# Patient Record
Sex: Female | Born: 1990 | Race: White | Hispanic: No | Marital: Married | Smoking: Current every day smoker
Health system: Southern US, Community
[De-identification: ages and names within clinical notes are randomized; demographics above are authoritative.]

## PROBLEM LIST (undated history)

## (undated) DIAGNOSIS — K219 Gastro-esophageal reflux disease without esophagitis: Secondary | ICD-10-CM

## (undated) DIAGNOSIS — W540XXA Bitten by dog, initial encounter: Secondary | ICD-10-CM

## (undated) DIAGNOSIS — S81852A Open bite, left lower leg, initial encounter: Secondary | ICD-10-CM

## (undated) DIAGNOSIS — D649 Anemia, unspecified: Secondary | ICD-10-CM

## (undated) HISTORY — PX: TONSILLECTOMY AND ADENOIDECTOMY: SUR1326

---

## 2014-12-28 ENCOUNTER — Other Ambulatory Visit (HOSPITAL_COMMUNITY): Payer: Self-pay | Admitting: Obstetrics and Gynecology

## 2014-12-28 ENCOUNTER — Encounter (HOSPITAL_COMMUNITY): Payer: Self-pay | Admitting: Obstetrics and Gynecology

## 2014-12-28 DIAGNOSIS — O30002 Twin pregnancy, unspecified number of placenta and unspecified number of amniotic sacs, second trimester: Secondary | ICD-10-CM

## 2014-12-28 DIAGNOSIS — O283 Abnormal ultrasonic finding on antenatal screening of mother: Secondary | ICD-10-CM

## 2014-12-28 DIAGNOSIS — Z3A26 26 weeks gestation of pregnancy: Secondary | ICD-10-CM

## 2015-01-15 ENCOUNTER — Encounter (HOSPITAL_COMMUNITY): Payer: Self-pay

## 2015-01-15 ENCOUNTER — Encounter (HOSPITAL_COMMUNITY): Payer: Self-pay | Admitting: Obstetrics and Gynecology

## 2015-01-15 ENCOUNTER — Ambulatory Visit (HOSPITAL_COMMUNITY)
Admission: RE | Admit: 2015-01-15 | Discharge: 2015-01-15 | Disposition: A | Discharge status: Home / Self Care | Payer: Medicaid Other | Source: Ambulatory Visit | Attending: Obstetrics and Gynecology | Admitting: Obstetrics and Gynecology

## 2015-01-15 ENCOUNTER — Other Ambulatory Visit (HOSPITAL_COMMUNITY): Payer: Self-pay | Admitting: Obstetrics and Gynecology

## 2015-01-15 DIAGNOSIS — O30002 Twin pregnancy, unspecified number of placenta and unspecified number of amniotic sacs, second trimester: Secondary | ICD-10-CM

## 2015-01-15 DIAGNOSIS — O98519 Other viral diseases complicating pregnancy, unspecified trimester: Secondary | ICD-10-CM | POA: Diagnosis not present

## 2015-01-15 DIAGNOSIS — B009 Herpesviral infection, unspecified: Secondary | ICD-10-CM | POA: Diagnosis not present

## 2015-01-15 DIAGNOSIS — O358XX0 Maternal care for other (suspected) fetal abnormality and damage, not applicable or unspecified: Secondary | ICD-10-CM | POA: Diagnosis not present

## 2015-01-15 DIAGNOSIS — O30042 Twin pregnancy, dichorionic/diamniotic, second trimester: Secondary | ICD-10-CM

## 2015-01-15 DIAGNOSIS — Z3A26 26 weeks gestation of pregnancy: Secondary | ICD-10-CM

## 2015-01-15 DIAGNOSIS — O283 Abnormal ultrasonic finding on antenatal screening of mother: Secondary | ICD-10-CM

## 2015-01-15 DIAGNOSIS — O359XX2 Maternal care for (suspected) fetal abnormality and damage, unspecified, fetus 2: Secondary | ICD-10-CM

## 2015-01-18 ENCOUNTER — Ambulatory Visit (HOSPITAL_COMMUNITY)
Admission: RE | Admit: 2015-01-18 | Discharge: 2015-01-18 | Disposition: A | Discharge status: Home / Self Care | Payer: Medicaid Other | Source: Ambulatory Visit | Attending: Obstetrics and Gynecology | Admitting: Obstetrics and Gynecology

## 2015-01-18 ENCOUNTER — Encounter (HOSPITAL_COMMUNITY): Payer: Self-pay

## 2015-01-18 DIAGNOSIS — O30042 Twin pregnancy, dichorionic/diamniotic, second trimester: Secondary | ICD-10-CM | POA: Insufficient documentation

## 2015-01-18 DIAGNOSIS — O283 Abnormal ultrasonic finding on antenatal screening of mother: Secondary | ICD-10-CM | POA: Diagnosis not present

## 2015-01-18 DIAGNOSIS — Z315 Encounter for genetic counseling: Secondary | ICD-10-CM | POA: Insufficient documentation

## 2015-01-18 DIAGNOSIS — Z3A26 26 weeks gestation of pregnancy: Secondary | ICD-10-CM | POA: Insufficient documentation

## 2015-01-18 DIAGNOSIS — O30049 Twin pregnancy, dichorionic/diamniotic, unspecified trimester: Secondary | ICD-10-CM

## 2015-01-19 DIAGNOSIS — O283 Abnormal ultrasonic finding on antenatal screening of mother: Secondary | ICD-10-CM | POA: Insufficient documentation

## 2015-01-19 DIAGNOSIS — O30049 Twin pregnancy, dichorionic/diamniotic, unspecified trimester: Secondary | ICD-10-CM | POA: Insufficient documentation

## 2015-01-19 DIAGNOSIS — O30009 Twin pregnancy, unspecified number of placenta and unspecified number of amniotic sacs, unspecified trimester: Secondary | ICD-10-CM | POA: Insufficient documentation

## 2015-01-19 NOTE — Progress Notes (Signed)
Genetic Counseling  High-Risk Gestation Note  Appointment Date:  01/18/2015 Referred By: Damaris Schooner, DO Date of Birth:  02-08-1991 Partner:  Lang Snow   Pregnancy History: [id number] Estimated Date of Delivery: 04/21/15 Estimated Gestational Age: 59w5dAttending: PBenjaman Lobe MD   I met with Mrs. TFaith Roguefor genetic counseling because of abnormal ultrasound findings in a dichorionic diamniotic twin gestation. She was accompanied by her cousin to today's visit.   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 Ultrasound performed on 01/15/15 in our office visualized dichorionic/diamniotic twin gestation. 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 Visualized fetal anatomy was within normal limits for twin A. Complete ultrasound results reported under separate cover.   We discussed that congenital heart defects occur at a frequency of approximately 1 in 100 births. We discussed that congenital heart defects can be genetic, environmental, or multifactorial in etiology.  This couple was counseled that congenital heart defects are prominent features in chromosome conditions, particularly autosomal trisomies. Specifically, a  VSD is associated with up to approximately 18% chance for a chromosome conditions. Additionally, congenital heart defects can be caused by single gene conditions, which can follow various patterns of inheritance. Environmental causes for congenital heart defects are well described and many teratogens are known to be linked to congenital heart disease (alcohol and specific medications).  Additionally, we discussed that the incidence of congenital heart defects is increased among individuals who have insulin dependent diabetes. The remainder of the cases (>70%) are classified as multifactorial, referring to the condition being caused by a combination of both environmental and genetic causes. We discussed that follow-up ultrasound and fetal echocardiogram results would assist in risk assessment for the pregnancy. In the case of a VSD, the chance for an underlying condition is increased. [facility]   Mrs. TRawas counseled that ventriculomegaly refers to an increased measurement of the lateral ventricles in the brain greater than 10 mm. Possible causes for mild ventriculomegaly include overproduction of cerebrospinal fluid, a blockage in a duct causing the fluid to accumulate, an intrauterine infection, or a variation of normal. We discussed that once ventriculomegaly is identified in pregnancy, follow-up ultrasounds are offered to assess for progression of ventriculomegaly. Following delivery, post-natal studies may be required in order to track progress and assess for any other differences in brain anatomy. It was discussed that surgical intervention may be required in some cases of ventriculomegaly. Most cases of isolated mild ventriculomegaly do not have an identified cause or associated condition, tend to regress with time, and have no resulting health or learning problems. However, it is possible that this finding may be seen  in association with other ultrasound differences or a genetic condition. There is also a slightly increased chance of developmental delays. We discussed that the presence of ventriculomegaly is associated with an increased risk for  aneuploidy, specifically Down syndrome.   We discussed that the combination of ultrasound findings for twin B increase the chance for a single underlying chromosome or genetic etiology for twin B. We reviewed chromosomes, nondisjunction, and the features and prognosis of Down syndrome. We also discussed examples of other forms of fetal aneuploidy including trisomy 34, trisomy 55, and sex chromosome aneuploidy.  We also reviewed that ultrasound findings can be caused by other chromosome aberrations including deletions, duplications, insertions. Mrs. Evelyn previously had Quad screening which was within normal range for Down syndrome. We reviewed the benefits and limitations of maternal serum screening and that the sensitivity and specificity are reduced in a twin gestation.   We reviewed the additional available screening option for chromosome conditions of noninvasive prenatal screening (NIPS)/prenatal cell free DNA (cfDNA) testing.  She was counseled that screening tests are used to modify a patient's a priori risk for aneuploidy, typically based on age. This estimate provides a pregnancy specific risk assessment. We reviewed the benefits and limitations of each option. Specifically, we discussed the conditions for which NIPS assesses, the detection rates, and false positive rates of each. We reviewed that detection rates and specificity rates are decreased in a twin gestation and that a separate risk assessment is not able to be provided for each twin but rather is provided for the pregnancy as a whole. They were also counseled regarding diagnostic testing via amniocentesis. We discussed that in addition to karyotype analysis, chromosome microarray could be performed from an  amniocentesis sample. A chromosomal microarray is a DNA analysis that looks for changes in copy number of genetic material. A control sample of DNA is used for comparison at thousands of loci across the genome. Microarray analysis allows for the detection of genetic deletions and duplications that are 814 times smaller than those identified by routine chromosome analysis.  We reviewed the approximate 1 in 481-856 risk for complications for amniocentesis, including spontaneous pregnancy loss. After consideration of all the options, she elected to proceed with NIPS (MaterniT21 through [employer]).  Those results will be available in 8-10 days.  Mrs. Rocchio declined amniocentesis given the associated risk for complications.   We also discussed the increased chance for single gene conditions, given the ultrasound findings. We reviewed that single gene conditions are not routinely assessment on tests such as amniocentesis unless ultrasound findings and/or family history are suggestive of a particular condition for which prenatal testing is available. Additionally, postnatal evaluation would be available by medical geneticist and assessment for single gene conditions, if warranted, may be performed at that time. She understands that screening tests cannot diagnose or rule out all birth defects or genetic syndromes. The patient was advised of this limitation and states she still does not want additional testing at this time. Follow-up ultrasound is scheduled for 02/12/15.   Both family histories were reviewed and found to be noncontributory for birth defects, intellectual disability, and known genetic conditions. Without further information regarding the provided family history, an accurate genetic risk cannot be calculated. Further genetic counseling is warranted if more information is obtained.   Mrs. Freid was provided with written information regarding cystic fibrosis (CF) including the carrier  frequency and incidence in the Caucasian population, the availability of carrier testing and prenatal diagnosis if indicated.  In addition, we discussed that CF is routinely screened for as part of the [place] newborn screening panel.  She declined CF testing today.   Mrs. Lottman denied exposure to environmental toxins or chemical agents.  She denied the use of alcohol or street drugs. She reported smoking approximately 2 cigarettes per day. The associations of smoking in pregnancy were reviewed and cessation encouraged. She denied significant viral illnesses during the course of her pregnancy. Her medical and surgical histories were noncontributory.   I counseled Mrs. Faith Rogue regarding the above risks and available options.  The approximate face-to-face time with the genetic counselor was 40 minutes.  Chipper Oman, MS Certified Genetic Counselor 01/19/2015

## 2015-01-25 ENCOUNTER — Telehealth (HOSPITAL_COMMUNITY): Payer: Self-pay | Admitting: MS"

## 2015-01-25 NOTE — Telephone Encounter (Signed)
Attempted to call patient regarding results of prenatal cell free DNA testing (MaterniT21), which were within normal range. Unable to leave message and patient did not answer.   Kaylee Ward Kaylee Ward 01/25/2015 12:47 PM

## 2015-01-28 ENCOUNTER — Other Ambulatory Visit (HOSPITAL_COMMUNITY): Payer: Self-pay | Admitting: Obstetrics and Gynecology

## 2015-01-28 ENCOUNTER — Telehealth (HOSPITAL_COMMUNITY): Payer: Self-pay | Admitting: MS"

## 2015-01-28 NOTE — Telephone Encounter (Signed)
Called Kaylee Ward to discuss her prenatal cell free DNA test results.  Ms. Kaylee Ward had MaterniT21 testing through [facility].  Testing was offered because of abnormal ultrasound findings in twin B in a dichorionic diamniotic twin gestation.   The patient was identified by name and DOB.  We reviewed that these are within normal limits for chromosomes 21, 18 and 13. Sex chromosome aneuploidy is not able to be assessed in twin gestation. Additionally, assessment for chromosomes 22, 16, and microdeletions from chromosomes 22q, 15q, 11q, 8q, 5p, 4p, and 1p were also performed and were not reported to be abnormal. We reviewed that this testing is highly specific and sensitive but that these rates are decreased in a twin gestation. She understands that this testing does not identify all genetic conditions.  All questions were answered to her satisfaction, she was encouraged to call with additional questions or concerns.  Quinn PlowmanKaren Odessia Asleson, MS Certified Genetic Counselor 01/28/2015 9:57 AM

## 2015-02-12 ENCOUNTER — Ambulatory Visit (HOSPITAL_COMMUNITY)
Admission: RE | Admit: 2015-02-12 | Discharge: 2015-02-12 | Disposition: A | Discharge status: Home / Self Care | Payer: Medicaid Other | Source: Ambulatory Visit | Attending: Obstetrics and Gynecology | Admitting: Obstetrics and Gynecology

## 2015-02-12 ENCOUNTER — Encounter (HOSPITAL_COMMUNITY): Payer: Self-pay

## 2015-02-12 ENCOUNTER — Other Ambulatory Visit (HOSPITAL_COMMUNITY): Payer: Self-pay | Admitting: Maternal and Fetal Medicine

## 2015-02-12 ENCOUNTER — Ambulatory Visit (HOSPITAL_COMMUNITY): Admission: RE | Admit: 2015-02-12 | Payer: Medicaid Other | Source: Ambulatory Visit

## 2015-02-12 VITALS — BP 105/64 | HR 76 | Wt 151.0 lb

## 2015-02-12 DIAGNOSIS — O98519 Other viral diseases complicating pregnancy, unspecified trimester: Secondary | ICD-10-CM | POA: Diagnosis not present

## 2015-02-12 DIAGNOSIS — O30049 Twin pregnancy, dichorionic/diamniotic, unspecified trimester: Secondary | ICD-10-CM

## 2015-02-12 DIAGNOSIS — O358XX2 Maternal care for other (suspected) fetal abnormality and damage, fetus 2: Secondary | ICD-10-CM | POA: Diagnosis not present

## 2015-02-12 DIAGNOSIS — B009 Herpesviral infection, unspecified: Secondary | ICD-10-CM | POA: Insufficient documentation

## 2015-02-12 DIAGNOSIS — O30042 Twin pregnancy, dichorionic/diamniotic, second trimester: Secondary | ICD-10-CM

## 2015-02-12 DIAGNOSIS — Z3A30 30 weeks gestation of pregnancy: Secondary | ICD-10-CM | POA: Insufficient documentation

## 2015-02-12 DIAGNOSIS — O283 Abnormal ultrasonic finding on antenatal screening of mother: Secondary | ICD-10-CM

## 2015-02-12 DIAGNOSIS — O365932 Maternal care for other known or suspected poor fetal growth, third trimester, fetus 2: Secondary | ICD-10-CM | POA: Diagnosis not present

## 2015-02-12 MED ORDER — BETAMETHASONE SOD PHOS & ACET 6 (3-3) MG/ML IJ SUSP
12.0000 mg | Freq: Once | INTRAMUSCULAR | Status: AC
  Administered 2015-02-12: 12 mg via INTRAMUSCULAR
  Filled 2015-02-12: qty 2

## 2015-02-19 ENCOUNTER — Other Ambulatory Visit (HOSPITAL_COMMUNITY): Payer: Self-pay | Admitting: Maternal and Fetal Medicine

## 2015-02-19 ENCOUNTER — Encounter (HOSPITAL_COMMUNITY): Payer: Self-pay

## 2015-02-19 ENCOUNTER — Ambulatory Visit (HOSPITAL_COMMUNITY)
Admission: RE | Admit: 2015-02-19 | Discharge: 2015-02-19 | Disposition: A | Discharge status: Home / Self Care | Payer: Medicaid Other | Source: Ambulatory Visit | Attending: Obstetrics and Gynecology | Admitting: Obstetrics and Gynecology

## 2015-02-19 DIAGNOSIS — O30049 Twin pregnancy, dichorionic/diamniotic, unspecified trimester: Secondary | ICD-10-CM

## 2015-02-19 DIAGNOSIS — O359XX2 Maternal care for (suspected) fetal abnormality and damage, unspecified, fetus 2: Secondary | ICD-10-CM

## 2015-02-19 DIAGNOSIS — O365932 Maternal care for other known or suspected poor fetal growth, third trimester, fetus 2: Secondary | ICD-10-CM

## 2015-02-19 DIAGNOSIS — O30042 Twin pregnancy, dichorionic/diamniotic, second trimester: Secondary | ICD-10-CM | POA: Insufficient documentation

## 2015-02-19 DIAGNOSIS — O98519 Other viral diseases complicating pregnancy, unspecified trimester: Secondary | ICD-10-CM | POA: Insufficient documentation

## 2015-02-19 DIAGNOSIS — O283 Abnormal ultrasonic finding on antenatal screening of mother: Secondary | ICD-10-CM

## 2015-02-19 DIAGNOSIS — Z3A31 31 weeks gestation of pregnancy: Secondary | ICD-10-CM

## 2015-02-19 DIAGNOSIS — O358XX0 Maternal care for other (suspected) fetal abnormality and damage, not applicable or unspecified: Secondary | ICD-10-CM | POA: Insufficient documentation

## 2015-02-25 ENCOUNTER — Other Ambulatory Visit (HOSPITAL_COMMUNITY): Payer: Self-pay | Admitting: Maternal and Fetal Medicine

## 2015-02-25 DIAGNOSIS — O359XX0 Maternal care for (suspected) fetal abnormality and damage, unspecified, not applicable or unspecified: Secondary | ICD-10-CM

## 2015-02-25 DIAGNOSIS — O98513 Other viral diseases complicating pregnancy, third trimester: Secondary | ICD-10-CM

## 2015-02-25 DIAGNOSIS — B009 Herpesviral infection, unspecified: Secondary | ICD-10-CM

## 2015-02-25 DIAGNOSIS — O30049 Twin pregnancy, dichorionic/diamniotic, unspecified trimester: Secondary | ICD-10-CM

## 2015-02-25 DIAGNOSIS — O365932 Maternal care for other known or suspected poor fetal growth, third trimester, fetus 2: Secondary | ICD-10-CM

## 2015-02-25 DIAGNOSIS — O283 Abnormal ultrasonic finding on antenatal screening of mother: Secondary | ICD-10-CM

## 2015-02-25 DIAGNOSIS — Z3A32 32 weeks gestation of pregnancy: Secondary | ICD-10-CM

## 2015-02-26 ENCOUNTER — Encounter (HOSPITAL_COMMUNITY): Payer: Self-pay

## 2015-02-26 ENCOUNTER — Other Ambulatory Visit (HOSPITAL_COMMUNITY): Payer: Self-pay

## 2015-02-26 ENCOUNTER — Ambulatory Visit (HOSPITAL_COMMUNITY)
Admission: RE | Admit: 2015-02-26 | Discharge: 2015-02-26 | Disposition: A | Discharge status: Home / Self Care | Payer: Medicaid Other | Source: Ambulatory Visit | Attending: Obstetrics and Gynecology | Admitting: Obstetrics and Gynecology

## 2015-02-26 DIAGNOSIS — B009 Herpesviral infection, unspecified: Secondary | ICD-10-CM | POA: Insufficient documentation

## 2015-02-26 DIAGNOSIS — O358XX0 Maternal care for other (suspected) fetal abnormality and damage, not applicable or unspecified: Secondary | ICD-10-CM | POA: Diagnosis not present

## 2015-02-26 DIAGNOSIS — O98519 Other viral diseases complicating pregnancy, unspecified trimester: Secondary | ICD-10-CM | POA: Insufficient documentation

## 2015-02-26 DIAGNOSIS — O30042 Twin pregnancy, dichorionic/diamniotic, second trimester: Secondary | ICD-10-CM | POA: Insufficient documentation

## 2015-02-26 DIAGNOSIS — O98513 Other viral diseases complicating pregnancy, third trimester: Secondary | ICD-10-CM

## 2015-02-26 DIAGNOSIS — Z3A32 32 weeks gestation of pregnancy: Secondary | ICD-10-CM | POA: Diagnosis not present

## 2015-02-26 DIAGNOSIS — O359XX0 Maternal care for (suspected) fetal abnormality and damage, unspecified, not applicable or unspecified: Secondary | ICD-10-CM

## 2015-02-26 DIAGNOSIS — O30049 Twin pregnancy, dichorionic/diamniotic, unspecified trimester: Secondary | ICD-10-CM

## 2015-02-26 DIAGNOSIS — O365932 Maternal care for other known or suspected poor fetal growth, third trimester, fetus 2: Secondary | ICD-10-CM | POA: Diagnosis not present

## 2015-02-26 DIAGNOSIS — O283 Abnormal ultrasonic finding on antenatal screening of mother: Secondary | ICD-10-CM

## 2015-03-05 ENCOUNTER — Ambulatory Visit (HOSPITAL_COMMUNITY)
Admission: RE | Admit: 2015-03-05 | Discharge: 2015-03-05 | Disposition: A | Discharge status: Home / Self Care | Payer: Medicaid Other | Source: Ambulatory Visit | Attending: Obstetrics and Gynecology | Admitting: Obstetrics and Gynecology

## 2015-03-05 ENCOUNTER — Encounter (HOSPITAL_COMMUNITY): Payer: Self-pay

## 2015-03-05 DIAGNOSIS — O30049 Twin pregnancy, dichorionic/diamniotic, unspecified trimester: Secondary | ICD-10-CM

## 2015-03-05 DIAGNOSIS — O30043 Twin pregnancy, dichorionic/diamniotic, third trimester: Secondary | ICD-10-CM | POA: Diagnosis not present

## 2015-03-05 DIAGNOSIS — O358XX0 Maternal care for other (suspected) fetal abnormality and damage, not applicable or unspecified: Secondary | ICD-10-CM | POA: Insufficient documentation

## 2015-03-05 DIAGNOSIS — O365932 Maternal care for other known or suspected poor fetal growth, third trimester, fetus 2: Secondary | ICD-10-CM | POA: Insufficient documentation

## 2015-03-05 DIAGNOSIS — Z3A33 33 weeks gestation of pregnancy: Secondary | ICD-10-CM | POA: Insufficient documentation

## 2015-03-05 DIAGNOSIS — O283 Abnormal ultrasonic finding on antenatal screening of mother: Secondary | ICD-10-CM

## 2015-03-12 ENCOUNTER — Ambulatory Visit (HOSPITAL_COMMUNITY)
Admission: RE | Admit: 2015-03-12 | Discharge: 2015-03-12 | Disposition: A | Discharge status: Home / Self Care | Payer: Medicaid Other | Source: Ambulatory Visit | Attending: Obstetrics and Gynecology | Admitting: Obstetrics and Gynecology

## 2015-03-12 ENCOUNTER — Encounter (HOSPITAL_COMMUNITY): Payer: Self-pay

## 2015-03-12 DIAGNOSIS — O30043 Twin pregnancy, dichorionic/diamniotic, third trimester: Secondary | ICD-10-CM | POA: Insufficient documentation

## 2015-03-12 DIAGNOSIS — O358XX0 Maternal care for other (suspected) fetal abnormality and damage, not applicable or unspecified: Secondary | ICD-10-CM | POA: Insufficient documentation

## 2015-03-12 DIAGNOSIS — O365932 Maternal care for other known or suspected poor fetal growth, third trimester, fetus 2: Secondary | ICD-10-CM | POA: Diagnosis not present

## 2015-03-12 DIAGNOSIS — Z3A34 34 weeks gestation of pregnancy: Secondary | ICD-10-CM | POA: Diagnosis not present

## 2015-03-12 DIAGNOSIS — O30049 Twin pregnancy, dichorionic/diamniotic, unspecified trimester: Secondary | ICD-10-CM

## 2015-03-18 HISTORY — PX: TUBAL LIGATION: SHX77

## 2015-03-19 ENCOUNTER — Other Ambulatory Visit (HOSPITAL_COMMUNITY): Payer: Self-pay | Admitting: Maternal and Fetal Medicine

## 2015-03-19 ENCOUNTER — Ambulatory Visit (HOSPITAL_COMMUNITY)
Admission: RE | Admit: 2015-03-19 | Discharge: 2015-03-19 | Disposition: A | Discharge status: Home / Self Care | Payer: Medicaid Other | Source: Ambulatory Visit | Attending: Obstetrics and Gynecology | Admitting: Obstetrics and Gynecology

## 2015-03-19 DIAGNOSIS — O358XX0 Maternal care for other (suspected) fetal abnormality and damage, not applicable or unspecified: Secondary | ICD-10-CM | POA: Insufficient documentation

## 2015-03-19 DIAGNOSIS — Z3A35 35 weeks gestation of pregnancy: Secondary | ICD-10-CM | POA: Insufficient documentation

## 2015-03-19 DIAGNOSIS — O30049 Twin pregnancy, dichorionic/diamniotic, unspecified trimester: Secondary | ICD-10-CM

## 2015-03-19 DIAGNOSIS — O365932 Maternal care for other known or suspected poor fetal growth, third trimester, fetus 2: Secondary | ICD-10-CM | POA: Diagnosis not present

## 2015-03-19 DIAGNOSIS — O30043 Twin pregnancy, dichorionic/diamniotic, third trimester: Secondary | ICD-10-CM | POA: Insufficient documentation

## 2015-03-19 DIAGNOSIS — O359XX2 Maternal care for (suspected) fetal abnormality and damage, unspecified, fetus 2: Secondary | ICD-10-CM

## 2015-03-26 ENCOUNTER — Ambulatory Visit (HOSPITAL_COMMUNITY): Payer: Medicaid Other | Attending: Obstetrics and Gynecology

## 2015-03-26 ENCOUNTER — Encounter (HOSPITAL_COMMUNITY): Payer: Self-pay

## 2015-11-20 ENCOUNTER — Encounter (HOSPITAL_COMMUNITY): Payer: Self-pay

## 2017-09-14 ENCOUNTER — Encounter (HOSPITAL_COMMUNITY): Payer: Self-pay | Admitting: Pharmacy Technician

## 2017-09-14 ENCOUNTER — Emergency Department (HOSPITAL_COMMUNITY): Payer: Medicaid Other | Admitting: Anesthesiology

## 2017-09-14 ENCOUNTER — Encounter (HOSPITAL_COMMUNITY)
Admission: EM | Disposition: A | Discharge status: Home / Self Care | Payer: Self-pay | Source: Home / Self Care | Attending: Emergency Medicine

## 2017-09-14 ENCOUNTER — Emergency Department (HOSPITAL_COMMUNITY): Payer: Medicaid Other

## 2017-09-14 ENCOUNTER — Observation Stay (HOSPITAL_COMMUNITY)
Admission: EM | Admit: 2017-09-14 | Discharge: 2017-09-15 | Disposition: A | Discharge status: Home / Self Care | Payer: Medicaid Other | Attending: Orthopaedic Surgery | Admitting: Orthopaedic Surgery

## 2017-09-14 ENCOUNTER — Other Ambulatory Visit: Payer: Self-pay

## 2017-09-14 DIAGNOSIS — W540XXA Bitten by dog, initial encounter: Secondary | ICD-10-CM | POA: Diagnosis not present

## 2017-09-14 DIAGNOSIS — S81852A Open bite, left lower leg, initial encounter: Secondary | ICD-10-CM | POA: Diagnosis not present

## 2017-09-14 DIAGNOSIS — F17200 Nicotine dependence, unspecified, uncomplicated: Secondary | ICD-10-CM | POA: Diagnosis not present

## 2017-09-14 HISTORY — DX: Bitten by dog, initial encounter: W54.0XXA

## 2017-09-14 HISTORY — DX: Open bite, left lower leg, initial encounter: S81.852A

## 2017-09-14 HISTORY — PX: I & D EXTREMITY: SHX5045

## 2017-09-14 HISTORY — PX: INCISION AND DRAINAGE: SHX5863

## 2017-09-14 SURGERY — IRRIGATION AND DEBRIDEMENT EXTREMITY
Anesthesia: General | Laterality: Left

## 2017-09-14 MED ORDER — MIDAZOLAM HCL 5 MG/5ML IJ SOLN
INTRAMUSCULAR | Status: DC | PRN
  Administered 2017-09-14: 2 mg via INTRAVENOUS

## 2017-09-14 MED ORDER — VANCOMYCIN HCL 1000 MG IV SOLR
INTRAVENOUS | Status: DC | PRN
  Administered 2017-09-14: 1000 mg via TOPICAL

## 2017-09-14 MED ORDER — OXYCODONE HCL 5 MG PO TABS
ORAL_TABLET | ORAL | Status: AC
  Filled 2017-09-14: qty 2

## 2017-09-14 MED ORDER — MORPHINE SULFATE (PF) 4 MG/ML IV SOLN
4.0000 mg | Freq: Once | INTRAVENOUS | Status: AC
  Administered 2017-09-14: 4 mg via INTRAVENOUS
  Filled 2017-09-14: qty 1

## 2017-09-14 MED ORDER — METOCLOPRAMIDE HCL 5 MG PO TABS: 5.0000 mg | ORAL_TABLET | Freq: Three times a day (TID) | ORAL | Status: DC | PRN

## 2017-09-14 MED ORDER — FENTANYL CITRATE (PF) 100 MCG/2ML IJ SOLN
INTRAMUSCULAR | Status: DC | PRN
  Administered 2017-09-14 (×2): 25 ug via INTRAVENOUS
  Administered 2017-09-14: 50 ug via INTRAVENOUS

## 2017-09-14 MED ORDER — BUPIVACAINE HCL (PF) 0.25 % IJ SOLN
INTRAMUSCULAR | Status: AC
  Filled 2017-09-14: qty 30

## 2017-09-14 MED ORDER — DIPHENHYDRAMINE HCL 12.5 MG/5ML PO ELIX: 12.5000 mg | ORAL_SOLUTION | ORAL | Status: DC | PRN

## 2017-09-14 MED ORDER — ONDANSETRON HCL 4 MG/2ML IJ SOLN: 4.0000 mg | Freq: Four times a day (QID) | INTRAMUSCULAR | Status: DC | PRN

## 2017-09-14 MED ORDER — ONDANSETRON HCL 4 MG/2ML IJ SOLN
INTRAMUSCULAR | Status: DC | PRN
  Administered 2017-09-14: 4 mg via INTRAVENOUS

## 2017-09-14 MED ORDER — CELECOXIB 200 MG PO CAPS
200.0000 mg | ORAL_CAPSULE | Freq: Two times a day (BID) | ORAL | Status: DC
  Administered 2017-09-14 – 2017-09-15 (×2): 200 mg via ORAL
  Filled 2017-09-14 (×2): qty 1

## 2017-09-14 MED ORDER — PHENYLEPHRINE HCL 10 MG/ML IJ SOLN
INTRAMUSCULAR | Status: DC | PRN
  Administered 2017-09-14: 80 ug via INTRAVENOUS

## 2017-09-14 MED ORDER — SODIUM CHLORIDE 0.9 % IR SOLN
Status: DC | PRN
  Administered 2017-09-14: 6000 mL

## 2017-09-14 MED ORDER — MIDAZOLAM HCL 2 MG/2ML IJ SOLN
INTRAMUSCULAR | Status: AC
  Filled 2017-09-14: qty 2

## 2017-09-14 MED ORDER — HYDROMORPHONE HCL 2 MG/ML IJ SOLN
0.5000 mg | INTRAMUSCULAR | Status: DC | PRN
  Administered 2017-09-15 (×2): 0.5 mg via INTRAVENOUS
  Filled 2017-09-14 (×2): qty 1

## 2017-09-14 MED ORDER — METOCLOPRAMIDE HCL 5 MG/ML IJ SOLN: 5.0000 mg | Freq: Three times a day (TID) | INTRAMUSCULAR | Status: DC | PRN

## 2017-09-14 MED ORDER — FENTANYL CITRATE (PF) 100 MCG/2ML IJ SOLN
25.0000 ug | INTRAMUSCULAR | Status: DC | PRN
  Administered 2017-09-14 (×2): 50 ug via INTRAVENOUS

## 2017-09-14 MED ORDER — BUPIVACAINE HCL (PF) 0.25 % IJ SOLN
INTRAMUSCULAR | Status: DC | PRN
  Administered 2017-09-14: 15 mL

## 2017-09-14 MED ORDER — PROMETHAZINE HCL 25 MG/ML IJ SOLN: 6.2500 mg | INTRAMUSCULAR | Status: DC | PRN

## 2017-09-14 MED ORDER — CEFAZOLIN SODIUM-DEXTROSE 1-4 GM/50ML-% IV SOLN
1.0000 g | Freq: Once | INTRAVENOUS | Status: AC
  Administered 2017-09-14: 1 g via INTRAVENOUS
  Filled 2017-09-14: qty 50

## 2017-09-14 MED ORDER — FENTANYL CITRATE (PF) 250 MCG/5ML IJ SOLN
INTRAMUSCULAR | Status: AC
Start: 2017-09-14 — End: ?
  Filled 2017-09-14: qty 5

## 2017-09-14 MED ORDER — OXYCODONE HCL 5 MG/5ML PO SOLN: 5.0000 mg | Freq: Once | ORAL | Status: DC | PRN

## 2017-09-14 MED ORDER — FENTANYL CITRATE (PF) 100 MCG/2ML IJ SOLN
INTRAMUSCULAR | Status: AC
  Administered 2017-09-14: 50 ug via INTRAVENOUS
  Filled 2017-09-14: qty 2

## 2017-09-14 MED ORDER — DEXAMETHASONE SODIUM PHOSPHATE 4 MG/ML IJ SOLN
INTRAMUSCULAR | Status: DC | PRN
  Administered 2017-09-14: 10 mg via INTRAVENOUS

## 2017-09-14 MED ORDER — PROPOFOL 10 MG/ML IV BOLUS
INTRAVENOUS | Status: DC | PRN
  Administered 2017-09-14: 180 mg via INTRAVENOUS

## 2017-09-14 MED ORDER — LIDOCAINE 2% (20 MG/ML) 5 ML SYRINGE
INTRAMUSCULAR | Status: DC | PRN
  Administered 2017-09-14: 60 mg via INTRAVENOUS

## 2017-09-14 MED ORDER — POVIDONE-IODINE 10 % EX SWAB: 2.0000 "application " | Freq: Once | CUTANEOUS | Status: DC

## 2017-09-14 MED ORDER — ZOLPIDEM TARTRATE 5 MG PO TABS: 5.0000 mg | ORAL_TABLET | Freq: Every evening | ORAL | Status: DC | PRN

## 2017-09-14 MED ORDER — ONDANSETRON HCL 4 MG PO TABS: 4.0000 mg | ORAL_TABLET | Freq: Four times a day (QID) | ORAL | Status: DC | PRN

## 2017-09-14 MED ORDER — PIPERACILLIN-TAZOBACTAM 3.375 G IVPB
3.3750 g | Freq: Four times a day (QID) | INTRAVENOUS | Status: DC
  Administered 2017-09-14 – 2017-09-15 (×3): 3.375 g via INTRAVENOUS
  Filled 2017-09-14 (×4): qty 50

## 2017-09-14 MED ORDER — DOCUSATE SODIUM 100 MG PO CAPS
100.0000 mg | ORAL_CAPSULE | Freq: Two times a day (BID) | ORAL | Status: DC
  Administered 2017-09-15: 100 mg via ORAL
  Filled 2017-09-14 (×2): qty 1

## 2017-09-14 MED ORDER — LACTATED RINGERS IV SOLN
INTRAVENOUS | Status: DC | PRN
  Administered 2017-09-14: 17:00:00 via INTRAVENOUS

## 2017-09-14 MED ORDER — PROPOFOL 10 MG/ML IV BOLUS
INTRAVENOUS | Status: AC
  Filled 2017-09-14: qty 20

## 2017-09-14 MED ORDER — OXYCODONE HCL 5 MG PO TABS
5.0000 mg | ORAL_TABLET | ORAL | Status: DC | PRN
  Administered 2017-09-14 – 2017-09-15 (×5): 10 mg via ORAL
  Filled 2017-09-14: qty 1
  Filled 2017-09-14 (×4): qty 2

## 2017-09-14 MED ORDER — CHLORHEXIDINE GLUCONATE 4 % EX LIQD
60.0000 mL | Freq: Once | CUTANEOUS | Status: DC
  Filled 2017-09-14: qty 60

## 2017-09-14 MED ORDER — HYDROMORPHONE HCL 2 MG/ML IJ SOLN
1.0000 mg | Freq: Once | INTRAMUSCULAR | Status: AC
  Administered 2017-09-14: 1 mg via INTRAVENOUS
  Filled 2017-09-14: qty 1

## 2017-09-14 MED ORDER — VANCOMYCIN HCL 1000 MG IV SOLR
INTRAVENOUS | Status: AC
  Filled 2017-09-14: qty 1000

## 2017-09-14 MED ORDER — ONDANSETRON HCL 4 MG/2ML IJ SOLN
4.0000 mg | Freq: Once | INTRAMUSCULAR | Status: AC
  Administered 2017-09-14: 4 mg via INTRAVENOUS
  Filled 2017-09-14: qty 2

## 2017-09-14 MED ORDER — OXYCODONE HCL 5 MG PO TABS: 5.0000 mg | ORAL_TABLET | Freq: Once | ORAL | Status: DC | PRN

## 2017-09-14 MED ORDER — CEFAZOLIN SODIUM-DEXTROSE 2-4 GM/100ML-% IV SOLN
2.0000 g | INTRAVENOUS | Status: AC
  Administered 2017-09-14: 2 g via INTRAVENOUS
  Filled 2017-09-14: qty 100

## 2017-09-14 MED ORDER — ACETAMINOPHEN 500 MG PO TABS
1000.0000 mg | ORAL_TABLET | Freq: Four times a day (QID) | ORAL | Status: AC
  Administered 2017-09-14 – 2017-09-15 (×4): 1000 mg via ORAL
  Filled 2017-09-14 (×4): qty 2

## 2017-09-14 SURGICAL SUPPLY — 34 items
BANDAGE ACE 4X5 VEL STRL LF (GAUZE/BANDAGES/DRESSINGS) ×3 IMPLANT
BNDG COHESIVE 6X5 TAN STRL LF (GAUZE/BANDAGES/DRESSINGS) ×3 IMPLANT
BNDG GAUZE ELAST 4 BULKY (GAUZE/BANDAGES/DRESSINGS) ×3 IMPLANT
COVER SURGICAL LIGHT HANDLE (MISCELLANEOUS) ×3 IMPLANT
DRAPE U-SHAPE 47X51 STRL (DRAPES) ×3 IMPLANT
DRSG PAD ABDOMINAL 8X10 ST (GAUZE/BANDAGES/DRESSINGS) ×6 IMPLANT
ELECT REM PT RETURN 9FT ADLT (ELECTROSURGICAL) ×3
ELECTRODE REM PT RTRN 9FT ADLT (ELECTROSURGICAL) ×1 IMPLANT
GAUZE SPONGE 4X4 12PLY STRL (GAUZE/BANDAGES/DRESSINGS) ×3 IMPLANT
GAUZE XEROFORM 5X9 LF (GAUZE/BANDAGES/DRESSINGS) ×3 IMPLANT
GLOVE BIOGEL PI IND STRL 8 (GLOVE) ×1 IMPLANT
GLOVE BIOGEL PI INDICATOR 8 (GLOVE) ×2
GLOVE ECLIPSE 8.0 STRL XLNG CF (GLOVE) ×6 IMPLANT
GOWN STRL REUS W/ TWL LRG LVL3 (GOWN DISPOSABLE) ×1 IMPLANT
GOWN STRL REUS W/ TWL XL LVL3 (GOWN DISPOSABLE) ×1 IMPLANT
GOWN STRL REUS W/TWL LRG LVL3 (GOWN DISPOSABLE) ×2
GOWN STRL REUS W/TWL XL LVL3 (GOWN DISPOSABLE) ×2
KIT BASIN OR (CUSTOM PROCEDURE TRAY) ×3 IMPLANT
KIT TURNOVER KIT B (KITS) ×3 IMPLANT
MANIFOLD NEPTUNE II (INSTRUMENTS) ×3 IMPLANT
NEEDLE HYPO 25GX1X1/2 BEV (NEEDLE) ×3 IMPLANT
NS IRRIG 1000ML POUR BTL (IV SOLUTION) ×3 IMPLANT
PACK ORTHO EXTREMITY (CUSTOM PROCEDURE TRAY) ×3 IMPLANT
PAD ARMBOARD 7.5X6 YLW CONV (MISCELLANEOUS) ×3 IMPLANT
SET IRRIG Y TYPE TUR BLADDER L (SET/KITS/TRAYS/PACK) ×3 IMPLANT
SUT ETHILON 2 0 FS 18 (SUTURE) ×3 IMPLANT
SUT ETHILON 3 0 PS 1 (SUTURE) ×6 IMPLANT
SYR CONTROL 10ML LL (SYRINGE) ×3 IMPLANT
TOWEL OR 17X24 6PK STRL BLUE (TOWEL DISPOSABLE) ×3 IMPLANT
TOWEL OR 17X26 10 PK STRL BLUE (TOWEL DISPOSABLE) ×3 IMPLANT
TUBE CONNECTING 12'X1/4 (SUCTIONS) ×1
TUBE CONNECTING 12X1/4 (SUCTIONS) ×2 IMPLANT
UNDERPAD 30X30 (UNDERPADS AND DIAPERS) ×6 IMPLANT
YANKAUER SUCT BULB TIP NO VENT (SUCTIONS) ×3 IMPLANT

## 2017-09-14 NOTE — Progress Notes (Signed)
Ricca Artelia LarocheM Alcocer is a 27 y.o. female patient admitted from PACU awake, alert - oriented  X 4 - no acute distress noted.  VSS - Blood pressure 115/82, pulse 67, temperature 98.7 F (37.1 C), temperature source Oral, resp. rate 15, height 5\' 5"  (1.651 m), weight 63.5 kg (140 lb), last menstrual period 09/09/2017, SpO2 100 %, unknown if currently breastfeeding.    IV in place, occlusive dsg intact without redness.       Will cont to eval and treat per MD orders.  Rolland PorterJosephine M Twylla Arceneaux, RN 09/14/2017 2000

## 2017-09-14 NOTE — Consult Note (Signed)
Reason for Consult:Dog bites Referring Physician: Salley Scarlet   Kaylee Ward is an 27 y.o. female.  HPI: Laveda was attacked by a pit bull that was attacking her dog earlier today. She was unsure about vaccinations of dog but she recently had a child so should be up to date on rabies per EDP. She came to the ED for evaluation. She has severe pain in the area of the bites but x-rays were negative for fx. Orthopedic surgery was consulted to address deep wounds.  History reviewed. No pertinent past medical history.  History reviewed. No pertinent surgical history.  No family history on file.  Social History:  has no tobacco, alcohol, and drug history on file.  Allergies: No Known Allergies  Medications: I have reviewed the patient's current medications.  No results found for this or any previous visit (from the past 48 hour(s)).  Dg Tibia/fibula Left  Result Date: 09/14/2017 CLINICAL DATA:  Dog bite to left lower leg. EXAM: LEFT TIBIA AND FIBULA - 2 VIEW COMPARISON:  None. FINDINGS: There is no evidence of fracture or other focal bone lesions. Soft tissue lacerations are seen involving the lower leg. No radiopaque foreign body is noted. IMPRESSION: No fracture or dislocation is noted. Electronically Signed   By: Lupita Raider, M.D.   On: 09/14/2017 15:16    Review of Systems  Constitutional: Negative for weight loss.  HENT: Negative for ear discharge, ear pain, hearing loss and tinnitus.   Eyes: Negative for blurred vision, double vision, photophobia and pain.  Respiratory: Negative for cough, sputum production and shortness of breath.   Cardiovascular: Negative for chest pain.  Gastrointestinal: Negative for abdominal pain, nausea and vomiting.  Genitourinary: Negative for dysuria, flank pain, frequency and urgency.  Musculoskeletal: Positive for joint pain (Left lower leg). Negative for back pain, falls, myalgias and neck pain.  Neurological: Negative for dizziness, tingling,  sensory change, focal weakness, loss of consciousness and headaches.  Endo/Heme/Allergies: Does not bruise/bleed easily.  Psychiatric/Behavioral: Negative for depression, memory loss and substance abuse. The patient is not nervous/anxious.    Blood pressure 123/78, pulse (!) 122, temperature 98.3 F (36.8 C), temperature source Oral, resp. rate 16, height  (1.651 m), weight 63.5 kg (140 lb), last menstrual period 09/09/2017, SpO2 100 %, unknown if currently breastfeeding. Physical Exam  Constitutional: She appears well-developed and well-nourished. No distress.  HENT:  Head: Normocephalic and atraumatic.  Eyes: Conjunctivae are normal. Right eye exhibits no discharge. Left eye exhibits no discharge. No scleral icterus.  Neck: Normal range of motion.  Cardiovascular: Normal rate and regular rhythm.  Respiratory: Effort normal. No respiratory distress.  Musculoskeletal:  LLE 3 lacerations to lower leg (see pics), worst transverse across posterior leg  Severe TTP  No knee or ankle effusion  Knee stable to varus/ valgus and anterior/posterior stress  Sens DPN, SPN, TN intact  Motor EHL, ext, flex, evers minimal movement 2/2 pain, suspect flexor/extensor mechanisms intact  DP 2+, PT 1+, No significant edema  Neurological: She is alert.  Skin: Skin is warm and dry. She is not diaphoretic.  Psychiatric: She has a normal mood and affect. Her behavior is normal.        Assessment/Plan: Left lower leg dog bites -- To OR for I&D and repair as necessary by Dr. Everardo Pacific. Keep NPO until then.    Freeman Caldron, PA-C Orthopedic Surgery [phone] 09/14/2017, 3:57 PM

## 2017-09-14 NOTE — Anesthesia Preprocedure Evaluation (Addendum)
Anesthesia Evaluation  Patient identified by MRN, date of birth, ID band Patient awake    Reviewed: Allergy & Precautions, NPO status , Patient's Chart, lab work & pertinent test results  History of Anesthesia Complications Negative for: history of anesthetic complications  Airway Mallampati: I  TM Distance: >3 FB Neck ROM: Full    Dental  (+) Dental Advisory Given, Poor Dentition,    Pulmonary Current Smoker,    breath sounds clear to auscultation       Cardiovascular negative cardio ROS   Rhythm:Regular Rate:Normal     Neuro/Psych negative neurological ROS  negative psych ROS   GI/Hepatic negative GI ROS, Neg liver ROS,   Endo/Other  negative endocrine ROS  Renal/GU negative Renal ROS  negative genitourinary   Musculoskeletal negative musculoskeletal ROS (+)   Abdominal   Peds  Hematology negative hematology ROS (+)   Anesthesia Other Findings   Reproductive/Obstetrics S/p tubal ligation                           Anesthesia Physical Anesthesia Plan  ASA: II and emergent  Anesthesia Plan: General   Post-op Pain Management:    Induction: Intravenous  PONV Risk Score and Plan: 3 and Treatment may vary due to age or medical condition, Ondansetron, Dexamethasone and Midazolam  Airway Management Planned: LMA  Additional Equipment: None  Intra-op Plan:   Post-operative Plan: Extubation in OR  Informed Consent: I have reviewed the patients History and Physical, chart, labs and discussed the procedure including the risks, benefits and alternatives for the proposed anesthesia with the patient or authorized representative who has indicated his/her understanding and acceptance.   Dental advisory given  Plan Discussed with: CRNA and Anesthesiologist  Anesthesia Plan Comments:        Anesthesia Quick Evaluation

## 2017-09-14 NOTE — ED Triage Notes (Signed)
Pt arrives via [facility] with reports of LLE pit bull bite. 5-6 penetrating wounds with flesh exposed. Pt unsure if dog is up to date on vaccines. Pt with some bite marks to L hand and forearm. Pt reports decreased ROM in L hand. Cold packs applied to leg. 118/78, RR 15, 99% RA, CBG 115, HR 90.

## 2017-09-14 NOTE — ED Provider Notes (Signed)
[facility]   CSN: [id number] Arrival date & time: 09/14/17  1428     History   Chief Complaint Chief Complaint  Patient presents with  . Animal Bite    HPI Kaylee Ward is a 27 y.o. female.  Patient is a 27 year old female with no significant medical history presenting today after she was bit by a pit bull.  This pit bull is in the home with her.  It is her mother-in-law's dog.  She states that it was starting to go for her dog and she picked up her dog and the pit bull bit her left leg and would not let go.  This dog has been violent in the past and has bit her on one other occasion.  The dog's vaccines are up-to-date.  Patient's tetanus shot is up-to-date.  She has no other area of injury.  She is complaining of significant pain in the lower leg region.  She denies any numbness or tingling but is having a hard time moving her foot because of pain.  The history is provided by the patient.  Animal Bite  Contact animal:  Dog Location:  Leg Leg injury location:  L lower leg Time since incident:  1 hour Pain details:    Quality:  Localized, sharp and stinging   Severity:  Severe   Timing:  Constant   Progression:  Unchanged Incident location:  Home Provoked: provoked   Notifications:  None Animal's rabies vaccination status:  Up to date Animal in possession: yes   Tetanus status:  Up to date Relieved by:  None tried Worsened by:  Activity Ineffective treatments:  None tried Associated symptoms: no numbness     No past medical history on file.  Patient Active Problem List   Diagnosis Date Noted  . Twin pregnancy, antepartum 01/19/2015  . Abnormal fetal ultrasound 01/19/2015  . Dichorionic diamniotic twin pregnancy, antepartum     No past surgical history on file.   OB History    Gravida  3   Para  2   Term  2   Preterm      AB      Living  2     SAB      TAB      Ectopic      Multiple     Live Births               Home Medications    Prior to Admission medications   Medication Sig Start Date End Date Taking? Authorizing Provider  Prenatal Vit w/Fe-Methylfol-FA (PNV PO) Take by mouth.    [provider]    Family History No family history on file.  Social History Social History   Tobacco Use  . Smoking status: Not on file  Substance Use Topics  . Alcohol use: Not on file  . Drug use: Not on file     Allergies   Patient has no known allergies.   Review of Systems Review of Systems  Neurological: Negative for numbness.  All other systems reviewed and are negative.    Physical Exam Updated Vital Signs BP 123/78 (BP Location: Right Arm)   Pulse (!) 122   Temp 98.3 F (36.8 C) (Oral)   Resp 16   Ht 5\' 5"  (1.651 m)   Wt 63.5 kg (140 lb)   SpO2 100%   BMI 23.30 kg/m   Physical Exam  Constitutional: She is oriented to person, place,  and time. She appears well-developed and well-nourished. No distress.  HENT:  Head: Normocephalic and atraumatic.  Mouth/Throat: Oropharynx is clear and moist.  Eyes: Pupils are equal, round, and reactive to light. Conjunctivae and EOM are normal.  Neck: Normal range of motion. Neck supple.  Cardiovascular: Regular rhythm and intact distal pulses. Tachycardia present.  No murmur heard. Pulmonary/Chest: Effort normal and breath sounds normal. No respiratory distress. She has no wheezes. She has no rales.  Abdominal: Soft. She exhibits no distension. There is no tenderness. There is no rebound and no guarding.  Musculoskeletal: Normal range of motion. She exhibits tenderness. She exhibits no edema.       Legs: Patient can wiggle all her toes and sensation is intact.  However when attempting to do dorsiflexion and plantar flexion patient is unable to perform most likely due to pain.  2+ DP pulse present.  Neurological: She is alert and oriented to person, place, and time.  Skin: Skin is warm and dry. No  rash noted. No erythema.  Psychiatric: She has a normal mood and affect. Her behavior is normal.  Nursing note and vitals reviewed.    ED Treatments / Results  Labs (all labs ordered are listed, but only abnormal results are displayed) Labs Reviewed - No data to display  EKG None  Radiology No results found.  Procedures Procedures (including critical care time)  Medications Ordered in ED Medications  morphine 4 MG/ML injection 4 mg (has no administration in time range)  ondansetron (ZOFRAN) injection 4 mg (has no administration in time range)  ceFAZolin (ANCEF) IVPB 1 g/50 mL premix (has no administration in time range)     Initial Impression / Assessment and Plan / ED Course  I have reviewed the triage vital signs and the nursing notes.  Pertinent labs & imaging results that were available during my care of the patient were reviewed by me and considered in my medical decision making (see chart for details).     Patient presenting today with large wound from dog bite to the lower leg.  Vascularly intact however difficult to examine all tendons due to pain.  Patient's tetanus shot is up-to-date.  We will do an x-ray to ensure no bony injury.  The dog vaccine status is up-to-date.  This was a provoked attack.  Patient was given pain control and Ancef.  Feel that she will most likely need washout before closure.   3:16 PM X-ray without acute signs of facture.  Will discuss with ortho for washout.  Final Clinical Impressions(s) / ED Diagnoses   Final diagnoses:  Dog bite, initial encounter    ED Discharge Orders    None       Gwyneth Sprout, MD 09/14/17 1517

## 2017-09-14 NOTE — Op Note (Signed)
Orthopaedic Surgery Operative Note (CSN: [id number])  Kaylee Ward  1991-03-24 Date of Surgery: 09/14/2017   Diagnoses:  Dog bite left lower leg  Procedure: 11044 -debridement bone, epidermis, dermis and subcutaneous tissue 20 cm 13121 -complex wound closure x3   Operative Finding Successful completion of planned procedure.  Anterior medial wound tracked down to bone.  No significant contamination of the wounds noted.  Achilles tendon intact.  Post-operative plan: The patient will be weightbearing as tolerated in boot.  The patient will be admitted overnight for observation and antibiotics.  DVT prophylaxis aspirin 325 once a day.  Pain control with PRN pain medication preferring oral medicines.  Follow up plan will be scheduled in approximately 7 days for incision check.  Post-Op Diagnosis: Same Surgeons:Primary: Bjorn Pippin, MD Assistants:Brandon Juan Quam OPA Location: [facility] OR ROOM 07 Anesthesia: General Antibiotics: Ancef 2g preop, Vancomycin  locally Tourniquet time: * No tourniquets in log * Estimated Blood Loss: minimal Complications: None Specimens: None Implants: * No implants in log *  Indications for Surgery:   Kaylee Ward is a 27 y.o. female with dog bite from a dog that supposedly had at the rabies vaccine and sustained multiple deep penetrating lacerations to the left lower leg.  Benefits and risks of operative and nonoperative management were discussed prior to surgery with patient/guardian(s) and informed consent form was completed.  Specific risks including infection, need for additional surgery, stiffness, wound complications, decreased range of motion.   Procedure:   The patient was identified in the preoperative holding area where the surgical site was marked. The patient was taken to the OR where a procedural timeout was called and the above noted anesthesia was induced.  The patient was positioned sloppy lateral.  Preoperative antibiotics were dosed.   The patient's left equal was prepped and draped in the usual sterile fashion.  A second preoperative timeout was called.      Began by exploring the wounds.  The posterior transverse laceration was across the muscular tendinous junction of the Achilles but did not appear to involve the Achilles tendon itself and the patient was able to demonstrate a normal Thompson test in the operating room.  We took care to excisionally debride with a ronguer the subcutaneous tissue muscle and the bone in the anterior incision as well as the other punctate wounds.  At this point we were happy with her excisional debridement and we proceeded to irrigate with 6 L of normal saline until the wound is free from any contamination.  We then placed 1 g of vancomycin powder and closed the incisions loosely with nonabsorbable suture for placing a sterile dressing and placing patient in a boot.  There were 3 separate complex wound closures totaling in length to about 25 nanometers.  The patient was awoken from general anesthesia and taken to the PACU in stable condition without complication.   Janace Litten, OPA-C, present and scrubbed throughout the case, critical for completion in a timely fashion, and for retraction, instrumentation, closure.

## 2017-09-14 NOTE — Anesthesia Procedure Notes (Signed)
Procedure Name: LMA Insertion Date/Time: 09/14/2017 5:17 PM Performed by: Ponciano OrtBrewer, Ozzie Remmers, CRNA Pre-anesthesia Checklist: Patient identified, Emergency Drugs available, Suction available and Patient being monitored Patient Re-evaluated:Patient Re-evaluated prior to induction Oxygen Delivery Method: Circle system utilized Preoxygenation: Pre-oxygenation with 100% oxygen Induction Type: IV induction LMA: LMA inserted LMA Size: 4.0 Placement Confirmation: positive ETCO2 and breath sounds checked- equal and bilateral Tube secured with: Tape

## 2017-09-14 NOTE — Transfer of Care (Signed)
Immediate Anesthesia Transfer of Care Note  Patient: Kaylee Ward  Procedure(s) Performed: IRRIGATION AND DEBRIDEMENT, COMPLEX WOUND CLOSURE LEFT LEG (Left )  Patient Location: PACU  Anesthesia Type:General  Level of Consciousness: drowsy  Airway & Oxygen Therapy: Patient Spontanous Breathing and Patient connected to face mask oxygen  Post-op Assessment: Report given to RN and Post -op Vital signs reviewed and stable  Post vital signs: Reviewed and stable  Last Vitals:  Vitals Value Taken Time  BP 109/73 09/14/2017  6:05 PM  Temp    Pulse 90 09/14/2017  6:06 PM  Resp 12 09/14/2017  6:06 PM  SpO2 100 % 09/14/2017  6:06 PM  Vitals shown include unvalidated device data.  Last Pain:  Vitals:   09/14/17 1805  TempSrc:   PainSc: (P) Asleep         Complications: No apparent anesthesia complications

## 2017-09-14 NOTE — Progress Notes (Signed)
Orthopedic Tech Progress Note Patient Details:  Kaylee Ward September 24, 1990 [id number]  Ortho Devices Type of Ortho Device: CAM walker Ortho Device/Splint Location: lle. I tryed to apply the cam walker. The Pt was having great pain. So I just left it with the pt.  Ortho Device/Splint Interventions: Rich BraveOrdered       Izzah Pasqua J 09/14/2017, 6:55 PM

## 2017-09-14 NOTE — ED Notes (Signed)
Lavender, Burna Mortimer, and [employer]. Dark Green top placed in [employer].

## 2017-09-15 MED ORDER — ACETAMINOPHEN 500 MG PO TABS: 1000.0000 mg | ORAL_TABLET | Freq: Three times a day (TID) | ORAL | 0 refills | Status: AC

## 2017-09-15 MED ORDER — ASPIRIN 81 MG PO TABS: 81.0000 mg | ORAL_TABLET | Freq: Every day | ORAL | 0 refills | Status: DC

## 2017-09-15 MED ORDER — OXYCODONE HCL 5 MG PO TABS: ORAL_TABLET | ORAL | 0 refills | Status: DC

## 2017-09-15 MED ORDER — ACETAMINOPHEN 500 MG PO TABS: 1000.0000 mg | ORAL_TABLET | Freq: Three times a day (TID) | ORAL | 0 refills | Status: DC

## 2017-09-15 MED ORDER — AMOXICILLIN-POT CLAVULANATE 875-125 MG PO TABS: 1.0000 | ORAL_TABLET | Freq: Two times a day (BID) | ORAL | 0 refills | Status: AC

## 2017-09-15 MED ORDER — AMOXICILLIN-POT CLAVULANATE 875-125 MG PO TABS: 1.0000 | ORAL_TABLET | Freq: Two times a day (BID) | ORAL | 0 refills | Status: DC

## 2017-09-15 MED ORDER — MELOXICAM 7.5 MG PO TABS: 7.5000 mg | ORAL_TABLET | Freq: Every day | ORAL | 2 refills | Status: DC

## 2017-09-15 MED ORDER — MELOXICAM 7.5 MG PO TABS: 7.5000 mg | ORAL_TABLET | Freq: Every day | ORAL | 2 refills | Status: AC

## 2017-09-15 MED ORDER — ONDANSETRON HCL 4 MG PO TABS: 4.0000 mg | ORAL_TABLET | Freq: Three times a day (TID) | ORAL | 1 refills | Status: AC | PRN

## 2017-09-15 MED ORDER — ONDANSETRON HCL 4 MG PO TABS: 4.0000 mg | ORAL_TABLET | Freq: Three times a day (TID) | ORAL | 1 refills | Status: DC | PRN

## 2017-09-15 MED ORDER — ASPIRIN 81 MG PO TABS: 81.0000 mg | ORAL_TABLET | Freq: Every day | ORAL | 0 refills | Status: AC

## 2017-09-15 MED ORDER — OXYCODONE HCL 5 MG PO TABS
ORAL_TABLET | ORAL | 0 refills | Status: DC
Start: 2017-09-15 — End: 2017-09-15

## 2017-09-15 NOTE — Progress Notes (Signed)
Orthopedic Tech Progress Note Patient Details:  Barbie Haggisiffany M Schiefelbein February 08, 1991 [id number]  Ortho Devices Type of Ortho Device: Crutches Ortho Device/Splint Location: lle. I tryed to apply the cam walker. The Pt was having great pain. So I just left it with the pt.  Ortho Device/Splint Interventions: Adjustment   Post Interventions Instructions Provided: Care of device, Adjustment of device   Saul FordyceJennifer C Deontrae Drinkard 09/15/2017, 11:41 AM

## 2017-09-15 NOTE — Discharge Summary (Addendum)
Patient ID: Barbie Haggisiffany M Beaston MRN: [id number] DOB/AGE: September 22, 1990 27 y.o.  Admit date: 09/14/2017 Discharge date: 09/15/2017  Admission Diagnoses:Left leg dogbite with deep posterior wound  Discharge Diagnoses:  Active Problems:   Dog bite   Past Medical History:  Diagnosis Date  . Dog bite of left lower leg 09/14/2017   S/P IRRIGATION AND DEBRIDEMENT, COMPLEX WOUND CLOSURE     Procedures Performed: I&D L leg  Discharged Condition: good  Hospital Course: Patient brought in from ER for surgery.  Tolerated procedure well.  Was kept for monitoring overnight for pain control and medical monitoring postop and was found to be stable for DC home the morning after surgery.  Patient was instructed on specific activity restrictions and all questions were answered.  Zosyn dosed while in house, transition to augmentin outpatient.   Consults: None  Significant Diagnostic Studies: No additional pertinent studies  Treatments: Surgery  Discharge Exam:  Dressing CDI, +EHL though remainder of motor difficult to test due to splint, sensation intact distally with warm well perfused foot, no pain w passive stretch  Disposition: Discharge disposition: 01-Home or Self Care       Discharge Instructions    Call MD for:  persistant nausea and vomiting   Complete by:  As directed    Call MD for:  redness, tenderness, or signs of infection (pain, swelling, redness, odor or green/yellow discharge around incision site)   Complete by:  As directed    Call MD for:  severe uncontrolled pain   Complete by:  As directed    Diet - low sodium heart healthy   Complete by:  As directed    Discharge instructions   Complete by:  As directed    Ramond Marrowax Masashi Snowdon MD, MPH [facility] [address], [address] [phone] (tel)   [phone] (fax)   POST-OPERATIVE INSTRUCTIONS   WOUND CARE Keep wound dry but change dry dressing every other day.    EXERCISES TDWB on the  affected extremity  POST-OP MEDICINES A multi-modal approach will be used to treat your pain. Oxycodone - This is a strong narcotic, to be used only on an "as needed" basis for pain. Acetaminophen 500mg - A non-narcotic pain medicine.  Use 1000mg  three times a day for the first 14 days after surgery   Zofran 4mg  - This is an anti-nausea medicine to be used only if you are having nausea or vomitting. Meloxicam - a non narctoic anti-inflammatory and pain reliever.  Do not take additional ibuprofen, naproxen or other NSAID while taking this medicine.  Aspirin 81mg  - This is a medicine used to help prevent blood clots.  Please use it daily.   If you have any adverse effects with the medications, please call our office.  FOLLOW-UP If you develop a Fever (>101.5), Redness or Drainage from the surgical incision site, please call our office to arrange for an evaluation. Please call the office to schedule a follow-up appointment for this week, Tuesday for a wound check.    IF YOU HAVE ANY QUESTIONS, PLEASE FEEL FREE TO CALL OUR OFFICE.  HELPFUL INFORMATION You should wean off your narcotic medicines as soon as you are able.  Most patients will be off or using minimal narcotics before their first postop appointment.    We suggest you use the pain medication the first night prior to going to bed, in order to ease any pain when the anesthesia wears off. You should avoid taking pain medications on an empty stomach  as it will make you nauseous.  Do not drink alcoholic beverages or take illicit drugs when taking pain medications.  In most states it is against the law to drive while you are in a splint or sling.  And certainly against the law to drive while taking narcotics.  You may return to work/school in the next couple of days when you feel up to it.    Pain medication may make you constipated.  Below are a few solutions to try in this order: Decrease the amount of pain medication if you aren't  having pain. Drink lots of decaffeinated fluids. Drink prune juice and/or each dried prunes  If the first 3 don't work start with additional solutions Take Colace - an over-the-counter stool softener Take Senokot - an over-the-counter laxative Take Miralax - a stronger over-the-counter laxative   Increase activity slowly   Complete by:  As directed      Allergies as of 09/15/2017   No Known Allergies     Medication List    TAKE these medications   acetaminophen 500 MG tablet Commonly known as:  TYLENOL Take 2 tablets (1,000 mg total) by mouth every 8 (eight) hours for 14 days.   amoxicillin-clavulanate 875-125 MG tablet Commonly known as:  AUGMENTIN Take 1 tablet by mouth 2 (two) times daily for 14 days.   aspirin 81 MG tablet Take 1 tablet (81 mg total) by mouth daily.   meloxicam 7.5 MG tablet Commonly known as:  MOBIC Take 1 tablet (7.5 mg total) by mouth daily.   ondansetron 4 MG tablet Commonly known as:  ZOFRAN Take 1 tablet (4 mg total) by mouth every 8 (eight) hours as needed for up to 7 days for nausea or vomiting.   oxyCODONE 5 MG immediate release tablet Commonly known as:  Oxy IR/ROXICODONE Take 1 pills every 6 hrs as needed for pain   PNV PO Take by mouth.      Follow-up Information    Bjorn Pippin, MD. Schedule an appointment as soon as possible for a visit on 09/18/2017.   Specialty:  Orthopedic Surgery Contact information: [address] [place] [address] [phone]

## 2017-09-16 ENCOUNTER — Emergency Department (HOSPITAL_BASED_OUTPATIENT_CLINIC_OR_DEPARTMENT_OTHER)
Admission: EM | Admit: 2017-09-16 | Discharge: 2017-09-16 | Disposition: A | Discharge status: Home / Self Care | Payer: Medicaid Other | Attending: Physician Assistant | Admitting: Physician Assistant

## 2017-09-16 ENCOUNTER — Other Ambulatory Visit: Payer: Self-pay

## 2017-09-16 ENCOUNTER — Encounter (HOSPITAL_COMMUNITY): Payer: Self-pay | Admitting: Orthopaedic Surgery

## 2017-09-16 DIAGNOSIS — F17210 Nicotine dependence, cigarettes, uncomplicated: Secondary | ICD-10-CM | POA: Diagnosis not present

## 2017-09-16 DIAGNOSIS — Z760 Encounter for issue of repeat prescription: Secondary | ICD-10-CM | POA: Insufficient documentation

## 2017-09-16 DIAGNOSIS — Z5189 Encounter for other specified aftercare: Secondary | ICD-10-CM

## 2017-09-16 DIAGNOSIS — Z4801 Encounter for change or removal of surgical wound dressing: Secondary | ICD-10-CM | POA: Insufficient documentation

## 2017-09-16 DIAGNOSIS — Z79899 Other long term (current) drug therapy: Secondary | ICD-10-CM | POA: Insufficient documentation

## 2017-09-16 DIAGNOSIS — Z7982 Long term (current) use of aspirin: Secondary | ICD-10-CM | POA: Insufficient documentation

## 2017-09-16 MED ORDER — MORPHINE SULFATE (PF) 4 MG/ML IV SOLN
4.0000 mg | Freq: Once | INTRAVENOUS | Status: AC
  Administered 2017-09-16: 4 mg via INTRAMUSCULAR
  Filled 2017-09-16: qty 1

## 2017-09-16 MED ORDER — OXYCODONE HCL 5 MG PO TABS: 5.0000 mg | ORAL_TABLET | Freq: Four times a day (QID) | ORAL | 0 refills | Status: DC | PRN

## 2017-09-16 NOTE — Anesthesia Postprocedure Evaluation (Signed)
Anesthesia Post Note  Patient: Barbie Haggisiffany M Toscano  Procedure(s) Performed: IRRIGATION AND DEBRIDEMENT, COMPLEX WOUND CLOSURE LEFT LEG (Left )     Patient location during evaluation: PACU Anesthesia Type: General Level of consciousness: awake and alert Pain management: pain level controlled Vital Signs Assessment: post-procedure vital signs reviewed and stable Respiratory status: spontaneous breathing, nonlabored ventilation, respiratory function stable and patient connected to nasal cannula oxygen Cardiovascular status: blood pressure returned to baseline and stable Postop Assessment: no apparent nausea or vomiting Anesthetic complications: no    Last Vitals:  Vitals:   09/15/17 0513 09/15/17 0935  BP: 106/71 102/70  Pulse: 64 (!) 54  Resp: (!) 21 17  Temp: 36.7 C 36.8 C  SpO2: 99% 98%    Last Pain:  Vitals:   09/15/17 0935  TempSrc: Oral  PainSc:                  Violet Seabury COKER

## 2017-09-16 NOTE — ED Triage Notes (Signed)
Pt was treated in the OR for a dog bite and discharged yesterday. Pt states she has taken all of the pain meds that were prescribed. She would like a refill. Pt has f/u appt with the surgeon on Tuesday.

## 2017-09-16 NOTE — Discharge Instructions (Signed)
It was my pleasure taking care of you today!   I have given you a short refill of your pain medication to help get you through until your appointment with the orthopedic doctor. Any further pain control will need to be prescribed through your orthopedic doctor managing your wound.  Return to ER for new or worsening symptoms, any additional concerns.

## 2017-09-16 NOTE — ED Provider Notes (Signed)
[facility] EMERGENCY DEPARTMENT Provider Note   CSN: [id number] Arrival date & time: 09/16/17  1308     History   Chief Complaint Chief Complaint  Patient presents with  . Wound Check  . Medication Refill    HPI Kaylee Ward is a 27 y.o. female.  The history is provided by the patient and medical records. No language interpreter was used.  Wound Check   Medication Refill   Kaylee Ward is a 27 y.o. female who presents to the Emergency Department complaining of persistent left lower extremity pain.  Patient was seen at [facility] on 5/31 for extensive dog bite to the left lower extremity requiring irrigation and debridement with complex wound closure.  She was discharged from the hospital yesterday morning with 12 5 mg OxyIR.  She reports taking to about every 6 hours and has now run out.  Instructions on bottle states to take 1-2 every 6 hours. No acute worsening in pain.   Past Medical History:  Diagnosis Date  . Dog bite of left lower leg 09/14/2017   S/P IRRIGATION AND DEBRIDEMENT, COMPLEX WOUND CLOSURE    Patient Active Problem List   Diagnosis Date Noted  . Dog bite 09/14/2017  . Twin pregnancy, antepartum 01/19/2015  . Abnormal fetal ultrasound 01/19/2015  . Dichorionic diamniotic twin pregnancy, antepartum     Past Surgical History:  Procedure Laterality Date  . I&D EXTREMITY Left 09/14/2017   Procedure: IRRIGATION AND DEBRIDEMENT, COMPLEX WOUND CLOSURE LEFT LEG;  Surgeon: Bjorn Pippin, MD;  Location: [facility] OR;  Service: Orthopedics;  Laterality: Left;  . INCISION AND DRAINAGE Left 09/14/2017   IRRIGATION AND DEBRIDEMENT, COMPLEX WOUND CLOSURE LEFT LEG  . TONSILLECTOMY AND ADENOIDECTOMY  ~ 2005  . TUBAL LIGATION  03/2015     OB History    Gravida  3   Para  2   Term  2   Preterm      AB      Living  2     SAB      TAB      Ectopic      Multiple      Live Births               Home Medications    Prior to Admission  medications   Medication Sig Start Date End Date Taking? Authorizing Provider  acetaminophen (TYLENOL) 500 MG tablet Take 2 tablets (1,000 mg total) by mouth every 8 (eight) hours for 14 days. 09/15/17 09/29/17  Bjorn Pippin, MD  amoxicillin-clavulanate (AUGMENTIN) 875-125 MG tablet Take 1 tablet by mouth 2 (two) times daily for 14 days. 09/15/17 09/29/17  Bjorn Pippin, MD  aspirin 81 MG tablet Take 1 tablet (81 mg total) by mouth daily. 09/15/17 10/30/17  Bjorn Pippin, MD  meloxicam (MOBIC) 7.5 MG tablet Take 1 tablet (7.5 mg total) by mouth daily. 09/15/17 09/15/18  Bjorn Pippin, MD  ondansetron (ZOFRAN) 4 MG tablet Take 1 tablet (4 mg total) by mouth every 8 (eight) hours as needed for up to 7 days for nausea or vomiting. 09/15/17 09/22/17  Bjorn Pippin, MD  oxyCODONE (OXY IR/ROXICODONE) 5 MG immediate release tablet Take 1 tablet (5 mg total) by mouth every 6 (six) hours as needed for severe pain. 09/16/17   Ward, Chase Picket, PA-C  Prenatal Vit w/Fe-Methylfol-FA (PNV PO) Take by mouth.    [provider]    Family History No family history on file.  Social History Social History   Tobacco Use  . Smoking status: Current Every Day Smoker    Packs/day: 0.50    Years: 7.00    Pack years: 3.50    Types: Cigarettes  . Smokeless tobacco: Never Used  Substance Use Topics  . Alcohol use: Yes    Comment: 09/14/2017 "maybe 3 shots/month"  . Drug use: Not Currently     Allergies   Patient has no known allergies.   Review of Systems Review of Systems  Musculoskeletal: Positive for myalgias.  Skin: Positive for wound.     Physical Exam Updated Vital Signs BP 125/86 (BP Location: Right Arm)   Pulse 80   Temp 98.5 F (36.9 C) (Oral)   Resp 18   LMP 09/09/2017 (Approximate)   SpO2 99%   Physical Exam  Constitutional: She appears well-developed and well-nourished. No distress.  HENT:  Head: Normocephalic and atraumatic.  Neck: Neck supple.  Cardiovascular: Normal rate,  regular rhythm and normal heart sounds.  No murmur heard. Pulmonary/Chest: Effort normal and breath sounds normal. No respiratory distress. She has no wheezes. She has no rales.  Musculoskeletal: Normal range of motion.  All compartments to the left lower extremity are soft.  2+ DP.  Able to wiggle all toes without difficulty.  Full range of motion.  Sensation intact.  Neurological: She is alert.  Skin: Skin is warm and dry.  Surgical suture wounds without any signs of erythema.  Clean, dry, intact.  Nursing note and vitals reviewed.    ED Treatments / Results  Labs (all labs ordered are listed, but only abnormal results are displayed) Labs Reviewed - No data to display  EKG None  Radiology Dg Tibia/fibula Left  Result Date: 09/14/2017 CLINICAL DATA:  Dog bite to left lower leg. EXAM: LEFT TIBIA AND FIBULA - 2 VIEW COMPARISON:  None. FINDINGS: There is no evidence of fracture or other focal bone lesions. Soft tissue lacerations are seen involving the lower leg. No radiopaque foreign body is noted. IMPRESSION: No fracture or dislocation is noted. Electronically Signed   By: Lupita RaiderJames  Green Jr, M.D.   On: 09/14/2017 15:16    Procedures Procedures (including critical care time)  Medications Ordered in ED Medications  morphine 4 MG/ML injection 4 mg (4 mg Intramuscular Given 09/16/17 1422)     Initial Impression / Assessment and Plan / ED Course  I have reviewed the triage vital signs and the nursing notes.  Pertinent labs & imaging results that were available during my care of the patient were reviewed by me and considered in my medical decision making (see chart for details).    Kaylee Ward is a 27 y.o. female who presents to ED for persistent left lower extremity pain after dog bite on 5/31. She was taken to OR for irrigation and debridement / wound closure. She was discharged yesterday with short course of OxyIR.  She has run out of this medication and requesting refill.  She  has no complaints of acute worsening of her pain, just that she is helped of pain medication, therefore it is not very well controlled.  She appears quite uncomfortable on exam.  Wounds are c/d/i with no signs of infection.  All compartments are soft.  Exam not concerning for compartment syndrome.  He has a follow-up appointment with orthopedics in 2 days.  [place] controlled substance database consulted with no other prescriptions for narcotics this year. Will provide VERY short course of pain refill to get her  to ortho appointment on Tuesday.  Discussed that any further prescription refills will have to be prescribed by orthopedist managing her wounds.  Reasons to return to ER discussed and all questions answered.   Patient discussed with Dr. Corlis Leak who agrees with treatment plan.    Final Clinical Impressions(s) / ED Diagnoses   Final diagnoses:  Visit for wound check    ED Discharge Orders        Ordered    oxyCODONE (OXY IR/ROXICODONE) 5 MG immediate release tablet  Every 6 hours PRN     09/16/17 1420       Ward, Chase Picket, PA-C 09/16/17 1433    Mackuen, Cindee Salt, MD 09/16/17 1504

## 2017-09-17 LAB — POCT PREGNANCY, URINE: PREG TEST UR: NEGATIVE

## 2021-09-17 ENCOUNTER — Inpatient Hospital Stay (HOSPITAL_COMMUNITY): Payer: Medicaid Other | Admitting: Certified Registered Nurse Anesthetist

## 2021-09-17 ENCOUNTER — Emergency Department (HOSPITAL_COMMUNITY): Payer: Medicaid Other

## 2021-09-17 ENCOUNTER — Encounter (HOSPITAL_COMMUNITY): Payer: Self-pay

## 2021-09-17 ENCOUNTER — Encounter (HOSPITAL_COMMUNITY): Admission: EM | Disposition: A | Discharge status: Rehabilitation Facility | Payer: Self-pay | Source: Home / Self Care

## 2021-09-17 ENCOUNTER — Inpatient Hospital Stay (HOSPITAL_COMMUNITY)
Admission: EM | Admit: 2021-09-17 | Discharge: 2021-09-24 | DRG: 463 | Disposition: A | Discharge status: Rehabilitation Facility | Payer: Medicaid Other | Attending: General Surgery | Admitting: General Surgery

## 2021-09-17 DIAGNOSIS — Y92410 Unspecified street and highway as the place of occurrence of the external cause: Secondary | ICD-10-CM | POA: Diagnosis not present

## 2021-09-17 DIAGNOSIS — S98221A Partial traumatic amputation of two or more right lesser toes, initial encounter: Secondary | ICD-10-CM | POA: Diagnosis present

## 2021-09-17 DIAGNOSIS — R112 Nausea with vomiting, unspecified: Secondary | ICD-10-CM | POA: Diagnosis not present

## 2021-09-17 DIAGNOSIS — S30810A Abrasion of lower back and pelvis, initial encounter: Secondary | ICD-10-CM | POA: Diagnosis present

## 2021-09-17 DIAGNOSIS — F1910 Other psychoactive substance abuse, uncomplicated: Secondary | ICD-10-CM | POA: Diagnosis not present

## 2021-09-17 DIAGNOSIS — S8011XA Contusion of right lower leg, initial encounter: Secondary | ICD-10-CM | POA: Diagnosis present

## 2021-09-17 DIAGNOSIS — S32059A Unspecified fracture of fifth lumbar vertebra, initial encounter for closed fracture: Secondary | ICD-10-CM | POA: Diagnosis present

## 2021-09-17 DIAGNOSIS — S92531B Displaced fracture of distal phalanx of right lesser toe(s), initial encounter for open fracture: Secondary | ICD-10-CM | POA: Diagnosis present

## 2021-09-17 DIAGNOSIS — S70211A Abrasion, right hip, initial encounter: Secondary | ICD-10-CM | POA: Diagnosis present

## 2021-09-17 DIAGNOSIS — S32421A Displaced fracture of posterior wall of right acetabulum, initial encounter for closed fracture: Secondary | ICD-10-CM | POA: Diagnosis present

## 2021-09-17 DIAGNOSIS — D62 Acute posthemorrhagic anemia: Secondary | ICD-10-CM | POA: Diagnosis not present

## 2021-09-17 DIAGNOSIS — S50311A Abrasion of right elbow, initial encounter: Secondary | ICD-10-CM | POA: Diagnosis present

## 2021-09-17 DIAGNOSIS — S98911A Complete traumatic amputation of right foot, level unspecified, initial encounter: Secondary | ICD-10-CM | POA: Diagnosis not present

## 2021-09-17 DIAGNOSIS — S300XXA Contusion of lower back and pelvis, initial encounter: Secondary | ICD-10-CM | POA: Diagnosis present

## 2021-09-17 DIAGNOSIS — S91309A Unspecified open wound, unspecified foot, initial encounter: Secondary | ICD-10-CM | POA: Diagnosis present

## 2021-09-17 DIAGNOSIS — S92311A Displaced fracture of first metatarsal bone, right foot, initial encounter for closed fracture: Secondary | ICD-10-CM | POA: Diagnosis present

## 2021-09-17 DIAGNOSIS — S50811A Abrasion of right forearm, initial encounter: Secondary | ICD-10-CM | POA: Diagnosis present

## 2021-09-17 DIAGNOSIS — S81811A Laceration without foreign body, right lower leg, initial encounter: Secondary | ICD-10-CM | POA: Diagnosis present

## 2021-09-17 DIAGNOSIS — D72829 Elevated white blood cell count, unspecified: Secondary | ICD-10-CM | POA: Diagnosis not present

## 2021-09-17 DIAGNOSIS — F1590 Other stimulant use, unspecified, uncomplicated: Secondary | ICD-10-CM | POA: Diagnosis present

## 2021-09-17 DIAGNOSIS — E43 Unspecified severe protein-calorie malnutrition: Secondary | ICD-10-CM | POA: Diagnosis present

## 2021-09-17 DIAGNOSIS — S32049A Unspecified fracture of fourth lumbar vertebra, initial encounter for closed fracture: Secondary | ICD-10-CM | POA: Diagnosis present

## 2021-09-17 DIAGNOSIS — S20411A Abrasion of right back wall of thorax, initial encounter: Secondary | ICD-10-CM | POA: Diagnosis present

## 2021-09-17 DIAGNOSIS — S32039A Unspecified fracture of third lumbar vertebra, initial encounter for closed fracture: Secondary | ICD-10-CM | POA: Diagnosis present

## 2021-09-17 DIAGNOSIS — F17210 Nicotine dependence, cigarettes, uncomplicated: Secondary | ICD-10-CM | POA: Diagnosis present

## 2021-09-17 DIAGNOSIS — S92421B Displaced fracture of distal phalanx of right great toe, initial encounter for open fracture: Principal | ICD-10-CM | POA: Diagnosis present

## 2021-09-17 DIAGNOSIS — R339 Retention of urine, unspecified: Secondary | ICD-10-CM | POA: Diagnosis not present

## 2021-09-17 DIAGNOSIS — S91301S Unspecified open wound, right foot, sequela: Secondary | ICD-10-CM | POA: Diagnosis not present

## 2021-09-17 DIAGNOSIS — M7989 Other specified soft tissue disorders: Secondary | ICD-10-CM | POA: Diagnosis not present

## 2021-09-17 DIAGNOSIS — T07XXXA Unspecified multiple injuries, initial encounter: Secondary | ICD-10-CM | POA: Diagnosis not present

## 2021-09-17 DIAGNOSIS — S98131A Complete traumatic amputation of one right lesser toe, initial encounter: Secondary | ICD-10-CM | POA: Diagnosis not present

## 2021-09-17 DIAGNOSIS — Z6823 Body mass index (BMI) 23.0-23.9, adult: Secondary | ICD-10-CM

## 2021-09-17 DIAGNOSIS — S92321A Displaced fracture of second metatarsal bone, right foot, initial encounter for closed fracture: Secondary | ICD-10-CM | POA: Diagnosis present

## 2021-09-17 DIAGNOSIS — S91301A Unspecified open wound, right foot, initial encounter: Secondary | ICD-10-CM | POA: Diagnosis present

## 2021-09-17 DIAGNOSIS — Z23 Encounter for immunization: Secondary | ICD-10-CM | POA: Diagnosis not present

## 2021-09-17 DIAGNOSIS — Z4781 Encounter for orthopedic aftercare following surgical amputation: Secondary | ICD-10-CM | POA: Diagnosis not present

## 2021-09-17 DIAGNOSIS — S32009D Unspecified fracture of unspecified lumbar vertebra, subsequent encounter for fracture with routine healing: Secondary | ICD-10-CM | POA: Diagnosis not present

## 2021-09-17 DIAGNOSIS — S32058S Other fracture of fifth lumbar vertebra, sequela: Secondary | ICD-10-CM | POA: Diagnosis not present

## 2021-09-17 DIAGNOSIS — S32401D Unspecified fracture of right acetabulum, subsequent encounter for fracture with routine healing: Secondary | ICD-10-CM | POA: Diagnosis not present

## 2021-09-17 DIAGNOSIS — S40811A Abrasion of right upper arm, initial encounter: Secondary | ICD-10-CM | POA: Diagnosis present

## 2021-09-17 HISTORY — PX: AMPUTATION TOE: SHX6595

## 2021-09-17 HISTORY — PX: I & D EXTREMITY: SHX5045

## 2021-09-17 HISTORY — PX: APPLICATION OF WOUND VAC: SHX5189

## 2021-09-17 LAB — CBC
HCT: 39.8 % (ref 36.0–46.0)
Hemoglobin: 13.5 g/dL (ref 12.0–15.0)
MCH: 31.9 pg (ref 26.0–34.0)
MCHC: 33.9 g/dL (ref 30.0–36.0)
MCV: 94.1 fL (ref 80.0–100.0)
Platelets: 181 10*3/uL (ref 150–400)
RBC: 4.23 MIL/uL (ref 3.87–5.11)
RDW: 11.9 % (ref 11.5–15.5)
WBC: 17.2 10*3/uL — ABNORMAL HIGH (ref 4.0–10.5)
nRBC: 0 % (ref 0.0–0.2)

## 2021-09-17 LAB — I-STAT CHEM 8, ED
BUN: 10 mg/dL (ref 6–20)
Calcium, Ion: 1.14 mmol/L — ABNORMAL LOW (ref 1.15–1.40)
Chloride: 97 mmol/L — ABNORMAL LOW (ref 98–111)
Creatinine, Ser: 0.8 mg/dL (ref 0.44–1.00)
Glucose, Bld: 126 mg/dL — ABNORMAL HIGH (ref 70–99)
HCT: 40 % (ref 36.0–46.0)
Hemoglobin: 13.6 g/dL (ref 12.0–15.0)
Potassium: 3.3 mmol/L — ABNORMAL LOW (ref 3.5–5.1)
Sodium: 139 mmol/L (ref 135–145)
TCO2: 27 mmol/L (ref 22–32)

## 2021-09-17 LAB — LACTIC ACID, PLASMA: Lactic Acid, Venous: 3.2 mmol/L (ref 0.5–1.9)

## 2021-09-17 LAB — COMPREHENSIVE METABOLIC PANEL
ALT: 24 U/L (ref 0–44)
AST: 53 U/L — ABNORMAL HIGH (ref 15–41)
Albumin: 3.9 g/dL (ref 3.5–5.0)
Alkaline Phosphatase: 55 U/L (ref 38–126)
Anion gap: 11 (ref 5–15)
BUN: 9 mg/dL (ref 6–20)
CO2: 23 mmol/L (ref 22–32)
Calcium: 8.2 mg/dL — ABNORMAL LOW (ref 8.9–10.3)
Chloride: 100 mmol/L (ref 98–111)
Creatinine, Ser: 0.95 mg/dL (ref 0.44–1.00)
GFR, Estimated: >60 mL/min (ref >60)
Glucose, Bld: 130 mg/dL — ABNORMAL HIGH (ref 70–99)
Potassium: 3.2 mmol/L — ABNORMAL LOW (ref 3.5–5.1)
Sodium: 134 mmol/L — ABNORMAL LOW (ref 135–145)
Total Bilirubin: 0.8 mg/dL (ref 0.3–1.2)
Total Protein: 6.3 g/dL — ABNORMAL LOW (ref 6.5–8.1)

## 2021-09-17 LAB — ETHANOL: Alcohol, Ethyl (B): <10 mg/dL (ref <10)

## 2021-09-17 LAB — I-STAT BETA HCG BLOOD, ED (MC, WL, AP ONLY): I-stat hCG, quantitative: <5 m[IU]/mL (ref <5)

## 2021-09-17 LAB — PROTIME-INR
INR: 1.1 (ref 0.8–1.2)
Prothrombin Time: 13.9 seconds (ref 11.4–15.2)

## 2021-09-17 SURGERY — IRRIGATION AND DEBRIDEMENT EXTREMITY
Anesthesia: General | Site: Foot | Laterality: Right

## 2021-09-17 MED ORDER — HYDROMORPHONE HCL 1 MG/ML IJ SOLN
INTRAMUSCULAR | Status: AC
  Administered 2021-09-17: 1 mg
  Filled 2021-09-17: qty 1

## 2021-09-17 MED ORDER — MIDAZOLAM HCL 2 MG/2ML IJ SOLN
INTRAMUSCULAR | Status: AC
  Filled 2021-09-17: qty 2

## 2021-09-17 MED ORDER — FENTANYL CITRATE (PF) 250 MCG/5ML IJ SOLN
INTRAMUSCULAR | Status: AC
  Filled 2021-09-17: qty 5

## 2021-09-17 MED ORDER — OXYCODONE HCL 5 MG PO TABS
10.0000 mg | ORAL_TABLET | ORAL | Status: DC | PRN
  Administered 2021-09-18 (×2): 10 mg via ORAL
  Filled 2021-09-17 (×2): qty 2

## 2021-09-17 MED ORDER — SUCCINYLCHOLINE CHLORIDE 200 MG/10ML IV SOSY
PREFILLED_SYRINGE | INTRAVENOUS | Status: AC
  Filled 2021-09-17: qty 10

## 2021-09-17 MED ORDER — METOPROLOL TARTRATE 5 MG/5ML IV SOLN: 5.0000 mg | Freq: Four times a day (QID) | INTRAVENOUS | Status: DC | PRN

## 2021-09-17 MED ORDER — LIDOCAINE HCL (CARDIAC) PF 100 MG/5ML IV SOSY
PREFILLED_SYRINGE | INTRAVENOUS | Status: DC | PRN
  Administered 2021-09-17: 60 mg via INTRAVENOUS

## 2021-09-17 MED ORDER — KETAMINE HCL 10 MG/ML IJ SOLN
INTRAMUSCULAR | Status: DC | PRN
  Administered 2021-09-17 (×2): 20 mg via INTRAVENOUS
  Administered 2021-09-18: 10 mg via INTRAVENOUS

## 2021-09-17 MED ORDER — HYDRALAZINE HCL 20 MG/ML IJ SOLN: 10.0000 mg | INTRAMUSCULAR | Status: DC | PRN

## 2021-09-17 MED ORDER — ENOXAPARIN SODIUM 30 MG/0.3ML IJ SOSY
30.0000 mg | PREFILLED_SYRINGE | Freq: Two times a day (BID) | INTRAMUSCULAR | Status: DC
  Administered 2021-09-18 – 2021-09-24 (×13): 30 mg via SUBCUTANEOUS
  Filled 2021-09-17 (×13): qty 0.3

## 2021-09-17 MED ORDER — SUCCINYLCHOLINE CHLORIDE 200 MG/10ML IV SOSY
PREFILLED_SYRINGE | INTRAVENOUS | Status: DC | PRN
  Administered 2021-09-17: 100 mg via INTRAVENOUS

## 2021-09-17 MED ORDER — GABAPENTIN 300 MG PO CAPS
300.0000 mg | ORAL_CAPSULE | Freq: Three times a day (TID) | ORAL | Status: DC
  Administered 2021-09-18 (×3): 300 mg via ORAL
  Filled 2021-09-17 (×4): qty 1

## 2021-09-17 MED ORDER — DOCUSATE SODIUM 100 MG PO CAPS
100.0000 mg | ORAL_CAPSULE | Freq: Two times a day (BID) | ORAL | Status: DC
  Administered 2021-09-18 – 2021-09-24 (×11): 100 mg via ORAL
  Filled 2021-09-17 (×14): qty 1

## 2021-09-17 MED ORDER — ONDANSETRON HCL 4 MG/2ML IJ SOLN
4.0000 mg | Freq: Four times a day (QID) | INTRAMUSCULAR | Status: DC | PRN
  Administered 2021-09-18 – 2021-09-23 (×7): 4 mg via INTRAVENOUS
  Filled 2021-09-17 (×9): qty 2

## 2021-09-17 MED ORDER — ONDANSETRON 4 MG PO TBDP
4.0000 mg | ORAL_TABLET | Freq: Four times a day (QID) | ORAL | Status: DC | PRN
  Administered 2021-09-21 – 2021-09-23 (×3): 4 mg via ORAL
  Filled 2021-09-17 (×3): qty 1

## 2021-09-17 MED ORDER — MIDAZOLAM HCL 5 MG/5ML IJ SOLN
INTRAMUSCULAR | Status: DC | PRN
  Administered 2021-09-17: 2 mg via INTRAVENOUS

## 2021-09-17 MED ORDER — SODIUM CHLORIDE 0.9 % IV SOLN
2.0000 g | INTRAVENOUS | Status: AC
  Administered 2021-09-17 – 2021-09-19 (×3): 2 g via INTRAVENOUS
  Filled 2021-09-17 (×3): qty 20

## 2021-09-17 MED ORDER — BISACODYL 10 MG RE SUPP: 10.0000 mg | Freq: Every day | RECTAL | Status: DC | PRN

## 2021-09-17 MED ORDER — SODIUM CHLORIDE 0.9 % IV SOLN: INTRAVENOUS | Status: DC

## 2021-09-17 MED ORDER — OXYCODONE HCL 5 MG PO TABS: 5.0000 mg | ORAL_TABLET | ORAL | Status: DC | PRN

## 2021-09-17 MED ORDER — ACETAMINOPHEN 500 MG PO TABS
1000.0000 mg | ORAL_TABLET | Freq: Four times a day (QID) | ORAL | Status: DC
  Administered 2021-09-18 – 2021-09-24 (×23): 1000 mg via ORAL
  Filled 2021-09-17 (×26): qty 2

## 2021-09-17 MED ORDER — IOHEXOL 300 MG/ML  SOLN
100.0000 mL | Freq: Once | INTRAMUSCULAR | Status: AC | PRN
  Administered 2021-09-17: 100 mL via INTRAVENOUS

## 2021-09-17 MED ORDER — PROPOFOL 10 MG/ML IV BOLUS
INTRAVENOUS | Status: AC
  Filled 2021-09-17: qty 20

## 2021-09-17 MED ORDER — BACITRACIN ZINC 500 UNIT/GM EX OINT
1.0000 "application " | TOPICAL_OINTMENT | Freq: Two times a day (BID) | CUTANEOUS | Status: DC
  Administered 2021-09-18 – 2021-09-24 (×12): 1 via TOPICAL
  Filled 2021-09-17 (×2): qty 28.4

## 2021-09-17 MED ORDER — ROCURONIUM BROMIDE 100 MG/10ML IV SOLN
INTRAVENOUS | Status: DC | PRN
  Administered 2021-09-17: 60 mg via INTRAVENOUS
  Administered 2021-09-18: 10 mg via INTRAVENOUS

## 2021-09-17 MED ORDER — KETAMINE HCL 50 MG/5ML IJ SOSY
20.0000 mg | PREFILLED_SYRINGE | Freq: Once | INTRAMUSCULAR | Status: AC
Start: 2021-09-17 — End: 2021-09-17
  Administered 2021-09-17: 20 mg via INTRAVENOUS
  Filled 2021-09-17: qty 5

## 2021-09-17 MED ORDER — CEFAZOLIN SODIUM-DEXTROSE 2-4 GM/100ML-% IV SOLN
2.0000 g | Freq: Three times a day (TID) | INTRAVENOUS | Status: DC
  Filled 2021-09-17: qty 100

## 2021-09-17 MED ORDER — ONDANSETRON HCL 4 MG/2ML IJ SOLN
4.0000 mg | Freq: Once | INTRAMUSCULAR | Status: AC
Start: 2021-09-17 — End: 2021-09-17
  Administered 2021-09-17: 4 mg via INTRAVENOUS
  Filled 2021-09-17: qty 2

## 2021-09-17 MED ORDER — METHOCARBAMOL 1000 MG/10ML IJ SOLN
500.0000 mg | Freq: Three times a day (TID) | INTRAVENOUS | Status: DC | PRN
  Administered 2021-09-18: 500 mg via INTRAVENOUS
  Filled 2021-09-17: qty 5

## 2021-09-17 MED ORDER — LACTATED RINGERS IV SOLN: INTRAVENOUS | Status: DC | PRN

## 2021-09-17 MED ORDER — PROPOFOL 10 MG/ML IV BOLUS
INTRAVENOUS | Status: DC | PRN
  Administered 2021-09-17: 100 mg via INTRAVENOUS

## 2021-09-17 MED ORDER — ONDANSETRON HCL 4 MG/2ML IJ SOLN
INTRAMUSCULAR | Status: DC | PRN
  Administered 2021-09-17: 4 mg via INTRAVENOUS

## 2021-09-17 MED ORDER — HYDROMORPHONE HCL 1 MG/ML IJ SOLN
0.5000 mg | INTRAMUSCULAR | Status: DC | PRN
  Administered 2021-09-18 (×9): 1 mg via INTRAVENOUS
  Filled 2021-09-17 (×9): qty 1

## 2021-09-17 MED ORDER — METHOCARBAMOL 500 MG PO TABS
500.0000 mg | ORAL_TABLET | Freq: Three times a day (TID) | ORAL | Status: DC | PRN
  Administered 2021-09-18: 500 mg via ORAL
  Filled 2021-09-17: qty 1

## 2021-09-17 MED ORDER — DEXAMETHASONE SODIUM PHOSPHATE 10 MG/ML IJ SOLN
INTRAMUSCULAR | Status: DC | PRN
  Administered 2021-09-17: 10 mg via INTRAVENOUS

## 2021-09-17 MED ORDER — KETAMINE HCL 50 MG/5ML IJ SOSY
PREFILLED_SYRINGE | INTRAMUSCULAR | Status: AC
  Filled 2021-09-17: qty 5

## 2021-09-17 MED ORDER — HYDROMORPHONE HCL 1 MG/ML IJ SOLN
0.5000 mg | INTRAMUSCULAR | Status: DC | PRN
  Filled 2021-09-17: qty 1

## 2021-09-17 MED ORDER — DEXMEDETOMIDINE (PRECEDEX) IN NS 20 MCG/5ML (4 MCG/ML) IV SYRINGE
PREFILLED_SYRINGE | INTRAVENOUS | Status: DC | PRN
  Administered 2021-09-17 – 2021-09-18 (×2): 12 ug via INTRAVENOUS
  Administered 2021-09-18 (×2): 8 ug via INTRAVENOUS

## 2021-09-17 SURGICAL SUPPLY — 66 items
BAG COUNTER SPONGE SURGICOUNT (BAG) IMPLANT
BAG SPNG CNTER NS LX DISP (BAG)
BNDG CMPR MED 10X6 ELC LF (GAUZE/BANDAGES/DRESSINGS) ×1
BNDG CMPR STD VLCR NS LF 5.8X4 (GAUZE/BANDAGES/DRESSINGS) ×1
BNDG COHESIVE 4X5 TAN STRL (GAUZE/BANDAGES/DRESSINGS) ×2 IMPLANT
BNDG ELASTIC 4X5.8 VLCR NS LF (GAUZE/BANDAGES/DRESSINGS) ×1 IMPLANT
BNDG ELASTIC 6X10 VLCR STRL LF (GAUZE/BANDAGES/DRESSINGS) ×1 IMPLANT
BNDG GAUZE ELAST 4 BULKY (GAUZE/BANDAGES/DRESSINGS) ×4 IMPLANT
BRUSH SCRUB EZ PLAIN DRY (MISCELLANEOUS) ×4 IMPLANT
CANISTER WOUND CARE 500ML ATS (WOUND CARE) ×1 IMPLANT
COVER SURGICAL LIGHT HANDLE (MISCELLANEOUS) ×5 IMPLANT
DRAPE U-SHAPE 47X51 STRL (DRAPES) ×1 IMPLANT
DRAPE U-SHAPE 48X52 POLY STRL (PACKS) ×1 IMPLANT
DRESSING MEPILEX FLEX 4X4 (GAUZE/BANDAGES/DRESSINGS) IMPLANT
DRSG ADAPTIC 3X8 NADH LF (GAUZE/BANDAGES/DRESSINGS) ×1 IMPLANT
DRSG MEPILEX FLEX 4X4 (GAUZE/BANDAGES/DRESSINGS) ×2
DRSG VAC ATS MED SENSATRAC (GAUZE/BANDAGES/DRESSINGS) ×2 IMPLANT
ELECT REM PT RETURN 9FT ADLT (ELECTROSURGICAL)
ELECTRODE REM PT RTRN 9FT ADLT (ELECTROSURGICAL) IMPLANT
GAUZE SPONGE 4X4 12PLY STRL (GAUZE/BANDAGES/DRESSINGS) ×2 IMPLANT
GAUZE SPONGE 4X4 12PLY STRL LF (GAUZE/BANDAGES/DRESSINGS) ×1 IMPLANT
GLOVE BIO SURGEON STRL SZ7.5 (GLOVE) ×3 IMPLANT
GLOVE BIO SURGEON STRL SZ8 (GLOVE) ×3 IMPLANT
GLOVE BIOGEL PI IND STRL 7.5 (GLOVE) ×1 IMPLANT
GLOVE BIOGEL PI IND STRL 8 (GLOVE) ×1 IMPLANT
GLOVE BIOGEL PI IND STRL 9 (GLOVE) ×1 IMPLANT
GLOVE BIOGEL PI INDICATOR 7.5 (GLOVE) ×1
GLOVE BIOGEL PI INDICATOR 8 (GLOVE) ×2
GLOVE BIOGEL PI INDICATOR 9 (GLOVE) ×1
GLOVE INDICATOR 7.0 STRL GRN (GLOVE) ×1 IMPLANT
GLOVE SURG ORTHO LTX SZ7.5 (GLOVE) ×4 IMPLANT
GOWN STRL REIN XL XLG (GOWN DISPOSABLE) ×1 IMPLANT
GOWN STRL REUS W/ TWL LRG LVL3 (GOWN DISPOSABLE) ×2 IMPLANT
GOWN STRL REUS W/ TWL XL LVL3 (GOWN DISPOSABLE) ×1 IMPLANT
GOWN STRL REUS W/TWL LRG LVL3 (GOWN DISPOSABLE) ×4
GOWN STRL REUS W/TWL XL LVL3 (GOWN DISPOSABLE) ×2
HANDPIECE INTERPULSE COAX TIP (DISPOSABLE)
KIT BASIN OR (CUSTOM PROCEDURE TRAY) ×2 IMPLANT
KIT TURNOVER KIT B (KITS) ×2 IMPLANT
MANIFOLD NEPTUNE II (INSTRUMENTS) ×2 IMPLANT
NS IRRIG 1000ML POUR BTL (IV SOLUTION) ×2 IMPLANT
PACK ORTHO EXTREMITY (CUSTOM PROCEDURE TRAY) ×2 IMPLANT
PAD ARMBOARD 7.5X6 YLW CONV (MISCELLANEOUS) ×4 IMPLANT
PADDING CAST ABS 4INX4YD NS (CAST SUPPLIES) ×4
PADDING CAST ABS COTTON 4X4 ST (CAST SUPPLIES) IMPLANT
PADDING CAST COTTON 6X4 STRL (CAST SUPPLIES) ×2 IMPLANT
PULSAVAC PLUS IRRIG FAN TIP (DISPOSABLE) ×2
SET HNDPC FAN SPRY TIP SCT (DISPOSABLE) IMPLANT
SOL PREP POV-IOD 4OZ 10% (MISCELLANEOUS) ×2 IMPLANT
SOL SCRUB PVP POV-IOD 4OZ 7.5% (MISCELLANEOUS) ×2
SOLUTION SCRB POV-IOD 4OZ 7.5% (MISCELLANEOUS) ×1 IMPLANT
SPLINT PLASTER CAST XFAST 5X30 (CAST SUPPLIES) IMPLANT
SPLINT PLASTER XFAST SET 5X30 (CAST SUPPLIES) ×2
SPONGE T-LAP 18X18 ~~LOC~~+RFID (SPONGE) ×2 IMPLANT
STOCKINETTE IMPERVIOUS 9X36 MD (GAUZE/BANDAGES/DRESSINGS) IMPLANT
SUT ETHILON 2 0 PSLX (SUTURE) ×1 IMPLANT
SUT PDS AB 0 CT1 27 (SUTURE) ×1 IMPLANT
SUT PDS AB 2-0 CT1 27 (SUTURE) ×2 IMPLANT
SUT PROLENE 2 0 CT2 30 (SUTURE) ×1 IMPLANT
TIP FAN IRRIG PULSAVAC PLUS (DISPOSABLE) IMPLANT
TOWEL GREEN STERILE (TOWEL DISPOSABLE) ×4 IMPLANT
TOWEL GREEN STERILE FF (TOWEL DISPOSABLE) ×2 IMPLANT
TUBE CONNECTING 12X1/4 (SUCTIONS) ×2 IMPLANT
UNDERPAD 30X36 HEAVY ABSORB (UNDERPADS AND DIAPERS) ×2 IMPLANT
WATER STERILE IRR 1000ML POUR (IV SOLUTION) ×2 IMPLANT
YANKAUER SUCT BULB TIP NO VENT (SUCTIONS) ×2 IMPLANT

## 2021-09-17 NOTE — Anesthesia Preprocedure Evaluation (Addendum)
Anesthesia Evaluation  Patient identified by MRN, date of birth, ID band Patient awake    Reviewed: Allergy & Precautions, H&P , NPO status , Patient's Chart, lab work & pertinent test results  Airway Mallampati: II  TM Distance: >3 FB Neck ROM: Full    Dental no notable dental hx. (+) Poor Dentition, Dental Advisory Given   Pulmonary Current Smoker,    Pulmonary exam normal breath sounds clear to auscultation       Cardiovascular negative cardio ROS   Rhythm:Regular Rate:Normal     Neuro/Psych negative neurological ROS  negative psych ROS   GI/Hepatic negative GI ROS, Neg liver ROS,   Endo/Other  negative endocrine ROS  Renal/GU negative Renal ROS  negative genitourinary   Musculoskeletal   Abdominal   Peds  Hematology negative hematology ROS (+)   Anesthesia Other Findings   Reproductive/Obstetrics negative OB ROS                            Anesthesia Physical Anesthesia Plan  ASA: 2 and emergent  Anesthesia Plan: General   Post-op Pain Management:    Induction: Intravenous, Rapid sequence and Cricoid pressure planned  PONV Risk Score and Plan: 3 and Ondansetron, Dexamethasone and Midazolam  Airway Management Planned: Oral ETT  Additional Equipment:   Intra-op Plan:   Post-operative Plan: Extubation in OR  Informed Consent: I have reviewed the patients History and Physical, chart, labs and discussed the procedure including the risks, benefits and alternatives for the proposed anesthesia with the patient or authorized representative who has indicated his/her understanding and acceptance.     Dental advisory given  Plan Discussed with: CRNA  Anesthesia Plan Comments:         Anesthesia Quick Evaluation

## 2021-09-17 NOTE — Progress Notes (Signed)
Orthopedic Tech Progress Note Patient Details:  Kaylee Ward [birth year] [id number]  Patient ID: Kaylee Ward, female   DOB: November 05, 1990, 31 y.o.   MRN: [id number] I attended trauma page. Trinna Post 09/17/2021, 10:47 PM
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
## 2021-09-18 ENCOUNTER — Inpatient Hospital Stay (HOSPITAL_COMMUNITY): Payer: Medicaid Other

## 2021-09-18 ENCOUNTER — Other Ambulatory Visit: Payer: Self-pay

## 2021-09-18 LAB — URINALYSIS, ROUTINE W REFLEX MICROSCOPIC
Bilirubin Urine: NEGATIVE
Glucose, UA: NEGATIVE mg/dL
Hgb urine dipstick: NEGATIVE
Ketones, ur: 5 mg/dL — AB
Leukocytes,Ua: NEGATIVE
Nitrite: NEGATIVE
Protein, ur: NEGATIVE mg/dL
Specific Gravity, Urine: 1.013 (ref 1.005–1.030)
pH: 8 (ref 5.0–8.0)

## 2021-09-18 LAB — PREALBUMIN: Prealbumin: 13.4 mg/dL — ABNORMAL LOW (ref 18–38)

## 2021-09-18 LAB — BASIC METABOLIC PANEL
Anion gap: 7 (ref 5–15)
BUN: 8 mg/dL (ref 6–20)
CO2: 25 mmol/L (ref 22–32)
Calcium: 8.1 mg/dL — ABNORMAL LOW (ref 8.9–10.3)
Chloride: 104 mmol/L (ref 98–111)
Creatinine, Ser: 0.81 mg/dL (ref 0.44–1.00)
GFR, Estimated: >60 mL/min (ref >60)
Glucose, Bld: 164 mg/dL — ABNORMAL HIGH (ref 70–99)
Potassium: 3.5 mmol/L (ref 3.5–5.1)
Sodium: 136 mmol/L (ref 135–145)

## 2021-09-18 LAB — RAPID URINE DRUG SCREEN, HOSP PERFORMED
Amphetamines: POSITIVE — AB
Barbiturates: NOT DETECTED
Benzodiazepines: POSITIVE — AB
Cocaine: NOT DETECTED
Opiates: POSITIVE — AB
Tetrahydrocannabinol: POSITIVE — AB

## 2021-09-18 LAB — CBC
HCT: 33.9 % — ABNORMAL LOW (ref 36.0–46.0)
Hemoglobin: 11.9 g/dL — ABNORMAL LOW (ref 12.0–15.0)
MCH: 31.8 pg (ref 26.0–34.0)
MCHC: 35.1 g/dL (ref 30.0–36.0)
MCV: 90.6 fL (ref 80.0–100.0)
Platelets: 145 10*3/uL — ABNORMAL LOW (ref 150–400)
RBC: 3.74 MIL/uL — ABNORMAL LOW (ref 3.87–5.11)
RDW: 11.8 % (ref 11.5–15.5)
WBC: 8.7 10*3/uL (ref 4.0–10.5)
nRBC: 0 % (ref 0.0–0.2)

## 2021-09-18 LAB — VITAMIN D 25 HYDROXY (VIT D DEFICIENCY, FRACTURES): Vit D, 25-Hydroxy: 38.94 ng/mL (ref 30–100)

## 2021-09-18 LAB — HIV ANTIBODY (ROUTINE TESTING W REFLEX): HIV Screen 4th Generation wRfx: NONREACTIVE

## 2021-09-18 LAB — ALBUMIN: Albumin: 3.4 g/dL — ABNORMAL LOW (ref 3.5–5.0)

## 2021-09-18 MED ORDER — METHOCARBAMOL 1000 MG/10ML IJ SOLN
1000.0000 mg | Freq: Three times a day (TID) | INTRAVENOUS | Status: DC
  Administered 2021-09-18: 1000 mg via INTRAVENOUS
  Filled 2021-09-18: qty 1000

## 2021-09-18 MED ORDER — LIDOCAINE 2% (20 MG/ML) 5 ML SYRINGE
INTRAMUSCULAR | Status: AC
  Filled 2021-09-18: qty 5

## 2021-09-18 MED ORDER — ASCORBIC ACID 500 MG PO TABS
500.0000 mg | ORAL_TABLET | Freq: Every day | ORAL | Status: DC
  Administered 2021-09-18: 500 mg via ORAL
  Filled 2021-09-18: qty 1

## 2021-09-18 MED ORDER — KETAMINE HCL 50 MG/5ML IJ SOSY: 30.0000 mg | PREFILLED_SYRINGE | Freq: Once | INTRAMUSCULAR | Status: DC

## 2021-09-18 MED ORDER — ZINC SULFATE 220 (50 ZN) MG PO CAPS
220.0000 mg | ORAL_CAPSULE | Freq: Every day | ORAL | Status: DC
  Administered 2021-09-18 – 2021-09-21 (×4): 220 mg via ORAL
  Filled 2021-09-18 (×4): qty 1

## 2021-09-18 MED ORDER — EPHEDRINE 5 MG/ML INJ
INTRAVENOUS | Status: AC
  Filled 2021-09-18: qty 5

## 2021-09-18 MED ORDER — HYDROMORPHONE HCL 1 MG/ML IJ SOLN
0.2500 mg | INTRAMUSCULAR | Status: DC | PRN
  Administered 2021-09-18 (×4): 0.5 mg via INTRAVENOUS

## 2021-09-18 MED ORDER — SUCCINYLCHOLINE CHLORIDE 200 MG/10ML IV SOSY
PREFILLED_SYRINGE | INTRAVENOUS | Status: AC
  Filled 2021-09-18: qty 10

## 2021-09-18 MED ORDER — SODIUM CHLORIDE 0.9 % IR SOLN
Status: DC | PRN
  Administered 2021-09-18 (×2): 3000 mL

## 2021-09-18 MED ORDER — ENSURE MAX PROTEIN PO LIQD
11.0000 [oz_av] | Freq: Two times a day (BID) | ORAL | Status: DC
  Administered 2021-09-18: 11 [oz_av] via ORAL
  Filled 2021-09-18 (×4): qty 330

## 2021-09-18 MED ORDER — 0.9 % SODIUM CHLORIDE (POUR BTL) OPTIME
TOPICAL | Status: DC | PRN
  Administered 2021-09-18: 1000 mL

## 2021-09-18 MED ORDER — OXYCODONE HCL 5 MG PO TABS
15.0000 mg | ORAL_TABLET | ORAL | Status: DC | PRN
  Administered 2021-09-18 – 2021-09-19 (×6): 15 mg via ORAL
  Filled 2021-09-18 (×7): qty 3

## 2021-09-18 MED ORDER — OXYCODONE HCL 5 MG PO TABS
10.0000 mg | ORAL_TABLET | ORAL | Status: DC | PRN
  Administered 2021-09-19: 10 mg via ORAL
  Filled 2021-09-18: qty 2

## 2021-09-18 MED ORDER — ASCORBIC ACID 500 MG PO TABS
1000.0000 mg | ORAL_TABLET | Freq: Every day | ORAL | Status: DC
Start: 2021-09-19 — End: 2021-09-21
  Administered 2021-09-19 – 2021-09-21 (×3): 1000 mg via ORAL
  Filled 2021-09-18 (×3): qty 2

## 2021-09-18 MED ORDER — HYDROMORPHONE HCL 1 MG/ML IJ SOLN
0.5000 mg | INTRAMUSCULAR | Status: AC | PRN
  Administered 2021-09-18 (×2): 0.5 mg via INTRAVENOUS

## 2021-09-18 MED ORDER — DEXMEDETOMIDINE HCL IN NACL 400 MCG/100ML IV SOLN
0.4000 ug/kg/h | INTRAVENOUS | Status: DC
  Filled 2021-09-18: qty 100

## 2021-09-18 MED ORDER — SUGAMMADEX SODIUM 200 MG/2ML IV SOLN
INTRAVENOUS | Status: DC | PRN
  Administered 2021-09-18: 200 mg via INTRAVENOUS

## 2021-09-18 MED ORDER — ROCURONIUM BROMIDE 10 MG/ML (PF) SYRINGE
PREFILLED_SYRINGE | INTRAVENOUS | Status: AC
  Filled 2021-09-18: qty 30

## 2021-09-18 MED ORDER — MIDAZOLAM HCL 2 MG/2ML IJ SOLN
INTRAMUSCULAR | Status: AC
  Filled 2021-09-18: qty 2

## 2021-09-18 MED ORDER — KETOROLAC TROMETHAMINE 30 MG/ML IJ SOLN
30.0000 mg | Freq: Three times a day (TID) | INTRAMUSCULAR | Status: DC
  Administered 2021-09-18 – 2021-09-19 (×3): 30 mg via INTRAVENOUS
  Filled 2021-09-18 (×3): qty 1

## 2021-09-18 MED ORDER — METHOCARBAMOL 500 MG PO TABS
1000.0000 mg | ORAL_TABLET | Freq: Three times a day (TID) | ORAL | Status: DC
  Administered 2021-09-19: 1000 mg via ORAL
  Filled 2021-09-18 (×2): qty 2

## 2021-09-18 MED ORDER — HYDROMORPHONE HCL 1 MG/ML IJ SOLN
INTRAMUSCULAR | Status: AC
  Filled 2021-09-18: qty 1

## 2021-09-18 MED ORDER — PHENYLEPHRINE 80 MCG/ML (10ML) SYRINGE FOR IV PUSH (FOR BLOOD PRESSURE SUPPORT)
PREFILLED_SYRINGE | INTRAVENOUS | Status: AC
  Filled 2021-09-18: qty 10

## 2021-09-18 MED ORDER — BUPIVACAINE-EPINEPHRINE (PF) 0.5% -1:200000 IJ SOLN
INTRAMUSCULAR | Status: DC | PRN
  Administered 2021-09-18: 30 mL via PERINEURAL

## 2021-09-18 MED ORDER — FENTANYL CITRATE (PF) 100 MCG/2ML IJ SOLN
INTRAMUSCULAR | Status: DC | PRN
Start: 2021-09-18 — End: 2021-09-18
  Administered 2021-09-18 (×2): 100 ug via INTRAVENOUS
  Administered 2021-09-18: 50 ug via INTRAVENOUS

## 2021-09-18 MED ORDER — MIDAZOLAM HCL 2 MG/2ML IJ SOLN
2.0000 mg | Freq: Once | INTRAMUSCULAR | Status: AC
  Administered 2021-09-18: 2 mg via INTRAVENOUS

## 2021-09-18 NOTE — Progress Notes (Signed)
OT Cancellation Note  Patient Details Name: Kaylee Ward MRN: [id number] DOB: 05-31-1990   Cancelled Treatment:    Reason Eval/Treat Not Completed: Patient not medically ready per Nursing. Nurse request hold on therapy all day. Will need to check tomorrow to see if pt is medically ready.   Theodoro Clock 09/18/2021, 8:53 AM

## 2021-09-18 NOTE — Anesthesia Procedure Notes (Signed)
Procedure Name: Intubation Date/Time: 09/17/2021 11:35 PM Performed by: Asal Teas T, CRNA Pre-anesthesia Checklist: Patient identified, Emergency Drugs available, Suction available and Patient being monitored Patient Re-evaluated:Patient Re-evaluated prior to induction Oxygen Delivery Method: Circle system utilized Preoxygenation: Pre-oxygenation with 100% oxygen Induction Type: IV induction, Rapid sequence and Cricoid Pressure applied Ventilation: Mask ventilation without difficulty Laryngoscope Size: Miller and 2 Grade View: Grade I Tube type: Oral Tube size: 7.5 mm Number of attempts: 1 Airway Equipment and Method: Stylet and Oral airway Placement Confirmation: ETT inserted through vocal cords under direct vision, positive ETCO2 and breath sounds checked- equal and bilateral Secured at: 21 cm Tube secured with: Tape Dental Injury: Teeth and Oropharynx as per pre-operative assessment

## 2021-09-18 NOTE — Progress Notes (Signed)
Orthopedic Tech Progress Note Patient Details:  Kaylee Ward [birth year] [id number]  Ortho Devices Type of Ortho Device: Lumbar corsett Ortho Device/Splint Interventions: Ordered     Brace dropped off in patients room with family member, as the patient was not in the room. OOB only instructions given to RN.  Darleen Crocker 09/18/2021, 10:25 AM
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
## 2021-09-18 NOTE — Transfer of Care (Signed)
Immediate Anesthesia Transfer of Care Note  Patient: ASANA NAKAGAWA  Procedure(s) Performed: IRRIGATION AND DEBRIDEMENT RIGHT FOOT, DEBRIDEMENT OF OPEN FRACTURES INCLUDED, CLOSED TREATMENT OF RIGHT ACETABULUM (Right: Foot) GUILLOTINE TRANSMETATARSAL AMPUTATION RIGHT FOOT (Right: Foot) APPLICATION OF WOUND VAC RIGHT FOOT (Right: Foot)  Patient Location: PACU  Anesthesia Type:General  Level of Consciousness: drowsy  Airway & Oxygen Therapy: Patient Spontanous Breathing and Patient connected to nasal cannula oxygen  Post-op Assessment: Report given to RN, Post -op Vital signs reviewed and stable and Patient moving all extremities  Post vital signs: Reviewed and stable  Last Vitals:  Vitals Value Taken Time  BP    Temp    Pulse 107 09/18/21 0156  Resp 12 09/18/21 0156  SpO2 98 % 09/18/21 0156  Vitals shown include unvalidated device data.  Last Pain:  Vitals:   09/17/21 2145  PainSc: 7          Complications: No notable events documented.
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
## 2021-09-18 NOTE — Progress Notes (Signed)
PT Cancellation Note  Patient Details Name: Kaylee Ward MRN: [id number] DOB: 15-Nov-1990   Cancelled Treatment:    Reason Eval/Treat Not Completed: Patient not medically ready Patient not medically ready per Nursing. Nurse request hold on therapy all day. Will need to check tomorrow to see if pt is medically ready.   Berton Mount 09/18/2021, 9:30 AM

## 2021-09-18 NOTE — Anesthesia Postprocedure Evaluation (Signed)
Anesthesia Post Note  Patient: Kaylee Ward  Procedure(s) Performed: IRRIGATION AND DEBRIDEMENT RIGHT FOOT, DEBRIDEMENT OF OPEN FRACTURES INCLUDED, CLOSED TREATMENT OF RIGHT  ACETABULUM WITH C-ARM (Right: Foot) GUILLOTINE TRANSMETATARSAL AMPUTATION RIGHT FOOT (Right: Foot) APPLICATION OF WOUND VAC RIGHT FOOT (Right: Foot)     Patient location during evaluation: PACU Anesthesia Type: General Level of consciousness: awake and alert Pain management: pain level controlled Vital Signs Assessment: post-procedure vital signs reviewed and stable Respiratory status: spontaneous breathing, nonlabored ventilation, respiratory function stable and patient connected to nasal cannula oxygen Cardiovascular status: blood pressure returned to baseline and stable Postop Assessment: no apparent nausea or vomiting Anesthetic complications: no   No notable events documented.  Last Vitals:  Vitals:   09/18/21 0352 09/18/21 0552  BP: 117/72 117/76  Pulse: 91 81  Resp: 12 15  Temp: 36.8 C 36.8 C  SpO2: 100% 100%    Last Pain:  Vitals:   09/18/21 0543  TempSrc:   PainSc: 5                  Oria Klimas,W. EDMOND

## 2021-09-18 NOTE — Anesthesia Procedure Notes (Signed)
Anesthesia Regional Block: Popliteal block   Pre-Anesthetic Checklist: , timeout performed,  Correct Patient, Correct Site, Correct Laterality,  Correct Procedure, Correct Position, site marked,  Risks and benefits discussed,  Pre-op evaluation,  At surgeon's request and post-op pain management  Laterality: Right  Prep: Maximum Sterile Barrier Precautions used, chloraprep       Needles:  Injection technique: Single-shot  Needle Type: Echogenic Stimulator Needle     Needle Length: 9cm  Needle Gauge: 21     Additional Needles:   Procedures:,,,, ultrasound used (permanent image in chart),,    Narrative:  Start time: 09/18/2021 3:01 AM End time: 09/18/2021 3:11 AM Injection made incrementally with aspirations every 5 mL.  Performed by: Personally  Anesthesiologist: Gaynelle Adu, MD  Additional Notes: Block done in PACU due to inability to control pain with other IV forms orf pain medications.
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
## 2021-09-18 NOTE — OR Nursing (Signed)
Chlorhexidine was used in the surgical wound to irrigate with the saline right foot
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
## 2021-09-19 ENCOUNTER — Encounter (HOSPITAL_COMMUNITY): Payer: Self-pay | Admitting: Orthopedic Surgery

## 2021-09-19 DIAGNOSIS — S91301A Unspecified open wound, right foot, initial encounter: Secondary | ICD-10-CM | POA: Diagnosis not present

## 2021-09-19 MED ORDER — HYDROMORPHONE HCL 1 MG/ML IJ SOLN: 2.0000 mg | INTRAMUSCULAR | Status: DC | PRN

## 2021-09-19 MED ORDER — CHLORHEXIDINE GLUCONATE CLOTH 2 % EX PADS
6.0000 | MEDICATED_PAD | Freq: Every day | CUTANEOUS | Status: DC
  Administered 2021-09-19 – 2021-09-24 (×5): 6 via TOPICAL

## 2021-09-19 MED ORDER — BOOST / RESOURCE BREEZE PO LIQD CUSTOM
1.0000 | Freq: Three times a day (TID) | ORAL | Status: DC
  Administered 2021-09-19 – 2021-09-22 (×4): 1 via ORAL

## 2021-09-19 MED ORDER — HYDROMORPHONE HCL 1 MG/ML IJ SOLN
1.0000 mg | INTRAMUSCULAR | Status: DC | PRN
  Administered 2021-09-19 (×4): 2 mg via INTRAVENOUS
  Filled 2021-09-19 (×4): qty 2

## 2021-09-19 MED ORDER — METHOCARBAMOL 500 MG PO TABS
1000.0000 mg | ORAL_TABLET | Freq: Four times a day (QID) | ORAL | Status: DC
  Administered 2021-09-19 – 2021-09-24 (×19): 1000 mg via ORAL
  Filled 2021-09-19 (×19): qty 2

## 2021-09-19 MED ORDER — ADULT MULTIVITAMIN W/MINERALS CH
1.0000 | ORAL_TABLET | Freq: Every day | ORAL | Status: DC
  Administered 2021-09-19 – 2021-09-24 (×6): 1 via ORAL
  Filled 2021-09-19 (×6): qty 1

## 2021-09-19 MED ORDER — DEXAMETHASONE SODIUM PHOSPHATE 10 MG/ML IJ SOLN
INTRAMUSCULAR | Status: AC
  Filled 2021-09-19: qty 1

## 2021-09-19 MED ORDER — PROCHLORPERAZINE EDISYLATE 10 MG/2ML IJ SOLN
10.0000 mg | Freq: Four times a day (QID) | INTRAMUSCULAR | Status: DC | PRN
  Administered 2021-09-19 – 2021-09-22 (×2): 10 mg via INTRAVENOUS
  Filled 2021-09-19 (×2): qty 2

## 2021-09-19 MED ORDER — TRAMADOL HCL 50 MG PO TABS
50.0000 mg | ORAL_TABLET | Freq: Four times a day (QID) | ORAL | Status: DC
  Administered 2021-09-19 – 2021-09-20 (×4): 50 mg via ORAL
  Filled 2021-09-19 (×4): qty 1

## 2021-09-19 MED ORDER — GABAPENTIN 400 MG PO CAPS
ORAL_CAPSULE | ORAL | Status: AC
  Filled 2021-09-19: qty 1

## 2021-09-19 MED ORDER — PANTOPRAZOLE SODIUM 40 MG PO TBEC
DELAYED_RELEASE_TABLET | ORAL | Status: AC
  Filled 2021-09-19: qty 1

## 2021-09-19 MED ORDER — JUVEN PO PACK
1.0000 | PACK | Freq: Two times a day (BID) | ORAL | Status: DC
  Administered 2021-09-20 – 2021-09-21 (×3): 1 via ORAL
  Filled 2021-09-19 (×2): qty 1

## 2021-09-19 MED ORDER — HYDROMORPHONE HCL 1 MG/ML IJ SOLN
1.0000 mg | INTRAMUSCULAR | Status: DC | PRN
  Administered 2021-09-19 – 2021-09-20 (×8): 2 mg via INTRAVENOUS
  Administered 2021-09-20 (×2): 1 mg via INTRAVENOUS
  Filled 2021-09-19 (×2): qty 2
  Filled 2021-09-19: qty 1
  Filled 2021-09-19: qty 2
  Filled 2021-09-19: qty 1
  Filled 2021-09-19 (×4): qty 2

## 2021-09-19 MED ORDER — METHOCARBAMOL 1000 MG/10ML IJ SOLN
1000.0000 mg | Freq: Four times a day (QID) | INTRAVENOUS | Status: DC
  Filled 2021-09-19: qty 10

## 2021-09-19 MED ORDER — BETHANECHOL CHLORIDE 10 MG PO TABS
10.0000 mg | ORAL_TABLET | Freq: Three times a day (TID) | ORAL | Status: DC
  Administered 2021-09-19 – 2021-09-23 (×14): 10 mg via ORAL
  Filled 2021-09-19 (×14): qty 1

## 2021-09-19 MED ORDER — KETOROLAC TROMETHAMINE 30 MG/ML IJ SOLN
30.0000 mg | Freq: Four times a day (QID) | INTRAMUSCULAR | Status: DC
  Administered 2021-09-19 – 2021-09-24 (×19): 30 mg via INTRAVENOUS
  Filled 2021-09-19 (×20): qty 1

## 2021-09-19 MED ORDER — HYDROMORPHONE HCL 1 MG/ML IJ SOLN
INTRAMUSCULAR | Status: AC
  Filled 2021-09-19: qty 2

## 2021-09-19 MED ORDER — GABAPENTIN 400 MG PO CAPS
400.0000 mg | ORAL_CAPSULE | Freq: Three times a day (TID) | ORAL | Status: DC
  Administered 2021-09-19 – 2021-09-22 (×9): 400 mg via ORAL
  Filled 2021-09-19 (×8): qty 1

## 2021-09-19 MED ORDER — POLYETHYLENE GLYCOL 3350 17 G PO PACK
17.0000 g | PACK | Freq: Every day | ORAL | Status: DC
  Administered 2021-09-19 – 2021-09-21 (×3): 17 g via ORAL
  Filled 2021-09-19 (×6): qty 1

## 2021-09-19 MED ORDER — PANTOPRAZOLE SODIUM 40 MG PO TBEC
40.0000 mg | DELAYED_RELEASE_TABLET | Freq: Two times a day (BID) | ORAL | Status: DC
  Administered 2021-09-19 – 2021-09-24 (×10): 40 mg via ORAL
  Filled 2021-09-19 (×10): qty 1

## 2021-09-19 MED ORDER — DEXAMETHASONE SODIUM PHOSPHATE 10 MG/ML IJ SOLN
10.0000 mg | Freq: Four times a day (QID) | INTRAMUSCULAR | Status: DC
  Administered 2021-09-19 – 2021-09-23 (×16): 10 mg via INTRAVENOUS
  Filled 2021-09-19 (×15): qty 1
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
## 2021-09-19 NOTE — Progress Notes (Addendum)
Pt has been moaning and yelling out in pain on a consistent basis since start of shift at 7 pm.  Pt c/o 10/10 pain in low back. Repositioning and ice pack have been ineffective. PRN and scheduled pain medications are not effective. RN paged the on-call trauma MD, Dr. Milford Cage. New verbal order: increase PRN dilaudid to 1-2 mg IV Q 2 hours.
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
## 2021-09-19 NOTE — Progress Notes (Signed)
Patient has refused supplement drink ordered by MD. She is drinking soda and water with her po medication. Urine is dark yellow, draining into drainage bag.Wound vac is working as expected. NS infusing continuously at 100 mL/hr. Patient and her mother continue to ask for more pain control. Mother stated to the patient, "We'll take care of it if I have to give you something myself". Report given to day shift nurse.

## 2021-09-19 NOTE — TOC CAGE-AID Note (Signed)
Transition of Care [facility]) - CAGE-AID Screening   Patient Details  Name: Kaylee Ward MRN: [id number] Date of Birth: 1990/12/26  [facility]) CM/SW Contact:    Omolola Mittman C Tarpley-Carter, LCSWA Phone Number: 09/19/2021, 11:04 AM   Clinical Narrative: Pt participated in Cage-Aid.  Pt stated she does use substance.  Pt was offered resources, due to usage of substance.    Shiloh Southern Tarpley-Carter, MSW, LCSW-A Pronouns:  She/Her/Hers [facility] HealthTransitions of Care Clinical Social Worker Direct Number:  [phone] [email or web address]  CAGE-AID Screening:    Have You Ever Felt You Ought to Cut Down on Your Drinking or Drug Use?: Yes Have People Annoyed You By [employer] Your Drinking Or Drug Use?: No Have You Felt Bad Or Guilty About Your Drinking Or Drug Use?: No Have You Ever Had a Drink or Used Drugs First Thing In The Morning to Steady Your Nerves or to Get Rid of a Hangover?: No CAGE-AID Score: 1  Substance Abuse Education Offered: Yes  Substance abuse interventions: Transport planner

## 2021-09-19 NOTE — Progress Notes (Signed)
? ?  Inpatient Rehab Admissions Coordinator : ? ?Per therapy recommendations, patient was screened for CIR candidacy by Jakell Trusty RN MSN.  At this time patient appears to be a potential candidate for CIR. I will place a rehab consult per protocol for full assessment. Please call me with any questions. ? ?Dain Laseter RN MSN ?Admissions Coordinator ?[phone] ?  ?
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
## 2021-09-19 NOTE — Progress Notes (Signed)
Inpatient Rehab Admissions Coordinator:     I met with pt. And her mother to discuss potential CIR admit. They are interested, and Pt.'s mother can provide 24/7 support In her home. I will follow and pursue for auth pending bed availability.   Clemens Catholic, [place], [place] Admissions Coordinator  [phone] ([place]) [phone] (office)
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
## 2021-09-20 ENCOUNTER — Encounter (HOSPITAL_COMMUNITY): Admission: EM | Disposition: A | Discharge status: Rehabilitation Facility | Payer: Self-pay | Source: Home / Self Care

## 2021-09-20 LAB — CBC
HCT: 28.1 % — ABNORMAL LOW (ref 36.0–46.0)
Hemoglobin: 9.6 g/dL — ABNORMAL LOW (ref 12.0–15.0)
MCH: 31.2 pg (ref 26.0–34.0)
MCHC: 34.2 g/dL (ref 30.0–36.0)
MCV: 91.2 fL (ref 80.0–100.0)
Platelets: 116 10*3/uL — ABNORMAL LOW (ref 150–400)
RBC: 3.08 MIL/uL — ABNORMAL LOW (ref 3.87–5.11)
RDW: 12.2 % (ref 11.5–15.5)
WBC: 10.5 10*3/uL (ref 4.0–10.5)
nRBC: 0 % (ref 0.0–0.2)

## 2021-09-20 LAB — SURGICAL PATHOLOGY

## 2021-09-20 LAB — SURGICAL PCR SCREEN
MRSA, PCR: POSITIVE — AB
Staphylococcus aureus: POSITIVE — AB

## 2021-09-20 LAB — BASIC METABOLIC PANEL
Anion gap: 5 (ref 5–15)
BUN: 7 mg/dL (ref 6–20)
CO2: 21 mmol/L — ABNORMAL LOW (ref 22–32)
Calcium: 8.2 mg/dL — ABNORMAL LOW (ref 8.9–10.3)
Chloride: 110 mmol/L (ref 98–111)
Creatinine, Ser: 0.62 mg/dL (ref 0.44–1.00)
GFR, Estimated: >60 mL/min (ref >60)
Glucose, Bld: 137 mg/dL — ABNORMAL HIGH (ref 70–99)
Potassium: 3.3 mmol/L — ABNORMAL LOW (ref 3.5–5.1)
Sodium: 136 mmol/L (ref 135–145)

## 2021-09-20 LAB — MAGNESIUM: Magnesium: 1.6 mg/dL — ABNORMAL LOW (ref 1.7–2.4)

## 2021-09-20 SURGERY — IRRIGATION AND DEBRIDEMENT EXTREMITY
Anesthesia: General | Laterality: Right

## 2021-09-20 MED ORDER — TRAMADOL HCL 50 MG PO TABS
50.0000 mg | ORAL_TABLET | Freq: Four times a day (QID) | ORAL | Status: DC | PRN
Start: 2021-09-20 — End: 2021-09-20

## 2021-09-20 MED ORDER — OXYCODONE HCL 5 MG PO TABS: 10.0000 mg | ORAL_TABLET | ORAL | Status: DC | PRN

## 2021-09-20 MED ORDER — POTASSIUM CHLORIDE CRYS ER 20 MEQ PO TBCR
40.0000 meq | EXTENDED_RELEASE_TABLET | Freq: Two times a day (BID) | ORAL | Status: AC
Start: 2021-09-20 — End: 2021-09-20
  Administered 2021-09-20 (×2): 40 meq via ORAL
  Filled 2021-09-20 (×2): qty 2

## 2021-09-20 MED ORDER — MUPIROCIN 2 % EX OINT
1.0000 "application " | TOPICAL_OINTMENT | Freq: Two times a day (BID) | CUTANEOUS | Status: DC
  Administered 2021-09-20 – 2021-09-24 (×9): 1 via NASAL
  Filled 2021-09-20 (×2): qty 22

## 2021-09-20 MED ORDER — HYDROMORPHONE HCL 1 MG/ML IJ SOLN
1.0000 mg | INTRAMUSCULAR | Status: DC | PRN
  Administered 2021-09-20 – 2021-09-21 (×8): 1 mg via INTRAVENOUS
  Filled 2021-09-20 (×8): qty 1

## 2021-09-20 MED ORDER — OXYCODONE HCL 5 MG PO TABS
15.0000 mg | ORAL_TABLET | ORAL | Status: DC | PRN
  Administered 2021-09-20 – 2021-09-21 (×3): 15 mg via ORAL
  Filled 2021-09-20 (×4): qty 3

## 2021-09-20 MED ORDER — OXYCODONE HCL ER 15 MG PO T12A
30.0000 mg | EXTENDED_RELEASE_TABLET | Freq: Two times a day (BID) | ORAL | Status: DC
  Administered 2021-09-20 – 2021-09-24 (×9): 30 mg via ORAL
  Filled 2021-09-20 (×9): qty 2

## 2021-09-20 MED ORDER — MAGNESIUM SULFATE 2 GM/50ML IV SOLN
2.0000 g | Freq: Once | INTRAVENOUS | Status: AC
  Administered 2021-09-20: 2 g via INTRAVENOUS
  Filled 2021-09-20: qty 50

## 2021-09-20 MED ORDER — LIDOCAINE 5 % EX PTCH
1.0000 | MEDICATED_PATCH | Freq: Every day | CUTANEOUS | Status: DC
  Administered 2021-09-20 – 2021-09-24 (×5): 1 via TRANSDERMAL
  Filled 2021-09-20 (×5): qty 1

## 2021-09-20 MED ORDER — FERROUS SULFATE 325 (65 FE) MG PO TABS
325.0000 mg | ORAL_TABLET | Freq: Two times a day (BID) | ORAL | Status: DC
  Administered 2021-09-20 – 2021-09-24 (×7): 325 mg via ORAL
  Filled 2021-09-20 (×7): qty 1

## 2021-09-20 MED ORDER — TRAMADOL HCL 50 MG PO TABS
50.0000 mg | ORAL_TABLET | Freq: Four times a day (QID) | ORAL | Status: DC | PRN
  Administered 2021-09-21: 50 mg via ORAL
  Filled 2021-09-20: qty 1
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
## 2021-09-20 NOTE — Progress Notes (Signed)
Inpatient Rehab Admissions Coordinator:    I do not have a bed for this pt. On CIR today. She is not yet medically ready (plans to return to OR tomorrow), but I confirmed she does have medicaid and will follow for potential admit pending bed availability and medical readiness.  Megan Salon, MS, CCC-SLP Rehab Admissions Coordinator  [phone] (celll) [phone] (office)
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
## 2021-09-21 ENCOUNTER — Other Ambulatory Visit: Payer: Self-pay

## 2021-09-21 ENCOUNTER — Inpatient Hospital Stay (HOSPITAL_COMMUNITY): Payer: Medicaid Other | Admitting: Anesthesiology

## 2021-09-21 ENCOUNTER — Encounter (HOSPITAL_COMMUNITY): Admission: EM | Disposition: A | Discharge status: Rehabilitation Facility | Payer: Self-pay | Source: Home / Self Care

## 2021-09-21 DIAGNOSIS — S98911A Complete traumatic amputation of right foot, level unspecified, initial encounter: Secondary | ICD-10-CM | POA: Diagnosis not present

## 2021-09-21 DIAGNOSIS — S91309A Unspecified open wound, unspecified foot, initial encounter: Secondary | ICD-10-CM | POA: Diagnosis not present

## 2021-09-21 HISTORY — PX: AMPUTATION: SHX166

## 2021-09-21 LAB — BASIC METABOLIC PANEL
Anion gap: 10 (ref 5–15)
BUN: 12 mg/dL (ref 6–20)
CO2: 21 mmol/L — ABNORMAL LOW (ref 22–32)
Calcium: 7.9 mg/dL — ABNORMAL LOW (ref 8.9–10.3)
Chloride: 105 mmol/L (ref 98–111)
Creatinine, Ser: 0.91 mg/dL (ref 0.44–1.00)
GFR, Estimated: >60 mL/min (ref >60)
Glucose, Bld: 153 mg/dL — ABNORMAL HIGH (ref 70–99)
Potassium: 3.3 mmol/L — ABNORMAL LOW (ref 3.5–5.1)
Sodium: 136 mmol/L (ref 135–145)

## 2021-09-21 LAB — CBC
HCT: 26.9 % — ABNORMAL LOW (ref 36.0–46.0)
Hemoglobin: 9.2 g/dL — ABNORMAL LOW (ref 12.0–15.0)
MCH: 31.5 pg (ref 26.0–34.0)
MCHC: 34.2 g/dL (ref 30.0–36.0)
MCV: 92.1 fL (ref 80.0–100.0)
Platelets: 148 10*3/uL — ABNORMAL LOW (ref 150–400)
RBC: 2.92 MIL/uL — ABNORMAL LOW (ref 3.87–5.11)
RDW: 12.4 % (ref 11.5–15.5)
WBC: 11.3 10*3/uL — ABNORMAL HIGH (ref 4.0–10.5)
nRBC: 0 % (ref 0.0–0.2)

## 2021-09-21 LAB — MAGNESIUM: Magnesium: 1.9 mg/dL (ref 1.7–2.4)

## 2021-09-21 SURGERY — AMPUTATION, FOOT, PARTIAL
Anesthesia: General | Site: Foot | Laterality: Right

## 2021-09-21 MED ORDER — ACETAMINOPHEN 325 MG PO TABS: 325.0000 mg | ORAL_TABLET | Freq: Four times a day (QID) | ORAL | Status: DC | PRN

## 2021-09-21 MED ORDER — HYDROMORPHONE HCL 1 MG/ML IJ SOLN
INTRAMUSCULAR | Status: AC
  Filled 2021-09-21: qty 2

## 2021-09-21 MED ORDER — OXYCODONE HCL 5 MG PO TABS: 5.0000 mg | ORAL_TABLET | Freq: Once | ORAL | Status: DC | PRN

## 2021-09-21 MED ORDER — PANTOPRAZOLE SODIUM 40 MG PO TBEC: 40.0000 mg | DELAYED_RELEASE_TABLET | Freq: Every day | ORAL | Status: DC

## 2021-09-21 MED ORDER — SODIUM CHLORIDE 0.9 % IV SOLN: INTRAVENOUS | Status: DC

## 2021-09-21 MED ORDER — OXYCODONE HCL 5 MG PO TABS
10.0000 mg | ORAL_TABLET | ORAL | Status: DC | PRN
  Administered 2021-09-21 – 2021-09-24 (×6): 15 mg via ORAL
  Filled 2021-09-21 (×6): qty 3

## 2021-09-21 MED ORDER — 0.9 % SODIUM CHLORIDE (POUR BTL) OPTIME
TOPICAL | Status: DC | PRN
  Administered 2021-09-21: 1000 mL

## 2021-09-21 MED ORDER — DEXAMETHASONE SODIUM PHOSPHATE 10 MG/ML IJ SOLN
INTRAMUSCULAR | Status: DC | PRN
  Administered 2021-09-21: 4 mg via INTRAVENOUS

## 2021-09-21 MED ORDER — ONDANSETRON HCL 4 MG/2ML IJ SOLN
4.0000 mg | Freq: Four times a day (QID) | INTRAMUSCULAR | Status: DC | PRN
  Administered 2021-09-21 – 2021-09-22 (×2): 4 mg via INTRAVENOUS
  Filled 2021-09-21: qty 2

## 2021-09-21 MED ORDER — FENTANYL CITRATE (PF) 250 MCG/5ML IJ SOLN
INTRAMUSCULAR | Status: AC
  Filled 2021-09-21: qty 5

## 2021-09-21 MED ORDER — FENTANYL CITRATE (PF) 250 MCG/5ML IJ SOLN
INTRAMUSCULAR | Status: DC | PRN
  Administered 2021-09-21 (×4): 25 ug via INTRAVENOUS

## 2021-09-21 MED ORDER — ONDANSETRON HCL 4 MG/2ML IJ SOLN
INTRAMUSCULAR | Status: DC | PRN
  Administered 2021-09-21: 4 mg via INTRAVENOUS

## 2021-09-21 MED ORDER — ASCORBIC ACID 500 MG PO TABS
1000.0000 mg | ORAL_TABLET | Freq: Every day | ORAL | Status: DC
  Administered 2021-09-22 – 2021-09-24 (×3): 1000 mg via ORAL
  Filled 2021-09-21 (×3): qty 2

## 2021-09-21 MED ORDER — PROPOFOL 10 MG/ML IV BOLUS
INTRAVENOUS | Status: DC | PRN
  Administered 2021-09-21: 200 mg via INTRAVENOUS

## 2021-09-21 MED ORDER — FENTANYL CITRATE (PF) 100 MCG/2ML IJ SOLN: 100.0000 ug | Freq: Once | INTRAMUSCULAR | Status: DC

## 2021-09-21 MED ORDER — LABETALOL HCL 5 MG/ML IV SOLN: 10.0000 mg | INTRAVENOUS | Status: DC | PRN

## 2021-09-21 MED ORDER — CEFAZOLIN SODIUM-DEXTROSE 2-4 GM/100ML-% IV SOLN
2.0000 g | Freq: Three times a day (TID) | INTRAVENOUS | Status: AC
  Administered 2021-09-22 (×2): 2 g via INTRAVENOUS
  Filled 2021-09-21 (×2): qty 100

## 2021-09-21 MED ORDER — CEFAZOLIN SODIUM-DEXTROSE 2-4 GM/100ML-% IV SOLN
2.0000 g | INTRAVENOUS | Status: AC
  Administered 2021-09-21: 2 g via INTRAVENOUS
  Filled 2021-09-21: qty 100

## 2021-09-21 MED ORDER — MIDAZOLAM HCL 2 MG/2ML IJ SOLN
0.5000 mg | Freq: Once | INTRAMUSCULAR | Status: AC | PRN
  Administered 2021-09-21: 1 mg via INTRAVENOUS

## 2021-09-21 MED ORDER — HYDRALAZINE HCL 20 MG/ML IJ SOLN: 5.0000 mg | INTRAMUSCULAR | Status: DC | PRN

## 2021-09-21 MED ORDER — ZINC SULFATE 220 (50 ZN) MG PO CAPS: 220.0000 mg | ORAL_CAPSULE | Freq: Every day | ORAL | Status: DC

## 2021-09-21 MED ORDER — CALCIUM CARBONATE ANTACID 500 MG PO CHEW
1.0000 | CHEWABLE_TABLET | Freq: Three times a day (TID) | ORAL | Status: DC
  Administered 2021-09-21 – 2021-09-22 (×2): 200 mg via ORAL
  Filled 2021-09-21 (×2): qty 1

## 2021-09-21 MED ORDER — POTASSIUM CHLORIDE CRYS ER 20 MEQ PO TBCR: 20.0000 meq | EXTENDED_RELEASE_TABLET | Freq: Every day | ORAL | Status: DC | PRN

## 2021-09-21 MED ORDER — PROPOFOL 10 MG/ML IV BOLUS
INTRAVENOUS | Status: AC
  Filled 2021-09-21: qty 20

## 2021-09-21 MED ORDER — JUVEN PO PACK
1.0000 | PACK | Freq: Two times a day (BID) | ORAL | Status: DC
  Filled 2021-09-21 (×2): qty 1

## 2021-09-21 MED ORDER — BUPIVACAINE-EPINEPHRINE (PF) 0.5% -1:200000 IJ SOLN
INTRAMUSCULAR | Status: DC | PRN
  Administered 2021-09-21: 20 mL via PERINEURAL
  Administered 2021-09-21: 10 mL via PERINEURAL

## 2021-09-21 MED ORDER — HYDROMORPHONE HCL 1 MG/ML IJ SOLN
0.2500 mg | INTRAMUSCULAR | Status: DC | PRN
  Administered 2021-09-21 (×4): 0.5 mg via INTRAVENOUS

## 2021-09-21 MED ORDER — LACTATED RINGERS IV SOLN: INTRAVENOUS | Status: DC

## 2021-09-21 MED ORDER — ZINC SULFATE 220 (50 ZN) MG PO CAPS
220.0000 mg | ORAL_CAPSULE | Freq: Every day | ORAL | Status: DC
  Administered 2021-09-22 – 2021-09-24 (×3): 220 mg via ORAL
  Filled 2021-09-21 (×3): qty 1

## 2021-09-21 MED ORDER — METOPROLOL TARTRATE 5 MG/5ML IV SOLN: 2.0000 mg | INTRAVENOUS | Status: DC | PRN

## 2021-09-21 MED ORDER — LIDOCAINE 2% (20 MG/ML) 5 ML SYRINGE
INTRAMUSCULAR | Status: DC | PRN
  Administered 2021-09-21: 20 mg via INTRAVENOUS

## 2021-09-21 MED ORDER — CHLORHEXIDINE GLUCONATE 4 % EX LIQD: 60.0000 mL | Freq: Once | CUTANEOUS | Status: DC

## 2021-09-21 MED ORDER — PHENOL 1.4 % MT LIQD: 1.0000 | OROMUCOSAL | Status: DC | PRN

## 2021-09-21 MED ORDER — MIDAZOLAM HCL 2 MG/2ML IJ SOLN
INTRAMUSCULAR | Status: AC
  Administered 2021-09-21: 2 mg
  Filled 2021-09-21: qty 2

## 2021-09-21 MED ORDER — CHLORHEXIDINE GLUCONATE 0.12 % MT SOLN: 15.0000 mL | Freq: Once | OROMUCOSAL | Status: DC

## 2021-09-21 MED ORDER — HYDROMORPHONE HCL 1 MG/ML IJ SOLN
0.5000 mg | INTRAMUSCULAR | Status: DC | PRN
  Filled 2021-09-21: qty 1

## 2021-09-21 MED ORDER — MIDAZOLAM HCL 2 MG/2ML IJ SOLN
2.0000 mg | Freq: Once | INTRAMUSCULAR | Status: AC
  Administered 2021-09-21: 1 mg via INTRAVENOUS

## 2021-09-21 MED ORDER — MEPERIDINE HCL 25 MG/ML IJ SOLN: 6.2500 mg | INTRAMUSCULAR | Status: DC | PRN

## 2021-09-21 MED ORDER — DEXAMETHASONE SODIUM PHOSPHATE 10 MG/ML IJ SOLN
INTRAMUSCULAR | Status: AC
  Filled 2021-09-21: qty 1

## 2021-09-21 MED ORDER — BISACODYL 5 MG PO TBEC: 5.0000 mg | DELAYED_RELEASE_TABLET | Freq: Every day | ORAL | Status: DC | PRN

## 2021-09-21 MED ORDER — CHLORHEXIDINE GLUCONATE 0.12 % MT SOLN
OROMUCOSAL | Status: AC
  Filled 2021-09-21: qty 15

## 2021-09-21 MED ORDER — GUAIFENESIN-DM 100-10 MG/5ML PO SYRP
15.0000 mL | ORAL_SOLUTION | ORAL | Status: DC | PRN
Start: 2021-09-21 — End: 2021-09-24

## 2021-09-21 MED ORDER — MAGNESIUM SULFATE 2 GM/50ML IV SOLN: 2.0000 g | Freq: Every day | INTRAVENOUS | Status: DC | PRN

## 2021-09-21 MED ORDER — HYDROMORPHONE HCL 1 MG/ML IJ SOLN
1.0000 mg | INTRAMUSCULAR | Status: DC | PRN
  Administered 2021-09-21 – 2021-09-22 (×4): 1 mg via INTRAVENOUS
  Filled 2021-09-21 (×3): qty 1

## 2021-09-21 MED ORDER — POVIDONE-IODINE 10 % EX SWAB: 2.0000 "application " | Freq: Once | CUTANEOUS | Status: DC

## 2021-09-21 MED ORDER — ONDANSETRON HCL 4 MG/2ML IJ SOLN
INTRAMUSCULAR | Status: AC
  Filled 2021-09-21: qty 2

## 2021-09-21 MED ORDER — POTASSIUM CHLORIDE CRYS ER 20 MEQ PO TBCR
40.0000 meq | EXTENDED_RELEASE_TABLET | Freq: Two times a day (BID) | ORAL | Status: AC
Start: 2021-09-21 — End: 2021-09-21
  Administered 2021-09-21 (×2): 40 meq via ORAL
  Filled 2021-09-21 (×2): qty 2

## 2021-09-21 MED ORDER — DOCUSATE SODIUM 100 MG PO CAPS: 100.0000 mg | ORAL_CAPSULE | Freq: Every day | ORAL | Status: DC

## 2021-09-21 MED ORDER — HYDROMORPHONE HCL 1 MG/ML IJ SOLN
INTRAMUSCULAR | Status: AC
  Filled 2021-09-21: qty 0.5

## 2021-09-21 MED ORDER — ASCORBIC ACID 500 MG PO TABS: 1000.0000 mg | ORAL_TABLET | Freq: Every day | ORAL | Status: DC

## 2021-09-21 MED ORDER — MIDAZOLAM HCL 2 MG/2ML IJ SOLN
INTRAMUSCULAR | Status: AC
  Filled 2021-09-21: qty 2

## 2021-09-21 MED ORDER — MAGNESIUM CITRATE PO SOLN: 1.0000 | Freq: Once | ORAL | Status: DC | PRN

## 2021-09-21 MED ORDER — ALUM & MAG HYDROXIDE-SIMETH 200-200-20 MG/5ML PO SUSP: 15.0000 mL | ORAL | Status: DC | PRN

## 2021-09-21 MED ORDER — HYDROMORPHONE HCL 1 MG/ML IJ SOLN
INTRAMUSCULAR | Status: DC | PRN
  Administered 2021-09-21 (×2): .25 mg via INTRAVENOUS

## 2021-09-21 MED ORDER — LACTATED RINGERS IV SOLN: INTRAVENOUS | Status: DC | PRN

## 2021-09-21 MED ORDER — LIDOCAINE 2% (20 MG/ML) 5 ML SYRINGE
INTRAMUSCULAR | Status: AC
  Filled 2021-09-21: qty 20

## 2021-09-21 MED ORDER — FENTANYL CITRATE (PF) 100 MCG/2ML IJ SOLN
INTRAMUSCULAR | Status: AC
  Administered 2021-09-21: 100 ug
  Filled 2021-09-21: qty 2

## 2021-09-21 MED ORDER — OXYCODONE HCL 5 MG PO TABS: 5.0000 mg | ORAL_TABLET | ORAL | Status: DC | PRN

## 2021-09-21 MED ORDER — ORAL CARE MOUTH RINSE: 15.0000 mL | Freq: Once | OROMUCOSAL | Status: DC

## 2021-09-21 MED ORDER — OXYCODONE HCL 5 MG/5ML PO SOLN: 5.0000 mg | Freq: Once | ORAL | Status: DC | PRN

## 2021-09-21 MED ORDER — POLYETHYLENE GLYCOL 3350 17 G PO PACK: 17.0000 g | PACK | Freq: Every day | ORAL | Status: DC | PRN

## 2021-09-21 SURGICAL SUPPLY — 38 items
APL SKNCLS STERI-STRIP NONHPOA (GAUZE/BANDAGES/DRESSINGS) ×1
BAG COUNTER SPONGE SURGICOUNT (BAG) ×2 IMPLANT
BAG SPNG CNTER NS LX DISP (BAG) ×1
BENZOIN TINCTURE PRP APPL 2/3 (GAUZE/BANDAGES/DRESSINGS) ×2 IMPLANT
BLADE SAW SGTL HD 18.5X60.5X1. (BLADE) ×2 IMPLANT
BLADE SURG 21 STRL SS (BLADE) ×2 IMPLANT
BNDG COHESIVE 4X5 TAN STRL (GAUZE/BANDAGES/DRESSINGS) IMPLANT
BNDG GAUZE ELAST 4 BULKY (GAUZE/BANDAGES/DRESSINGS) IMPLANT
COVER SURGICAL LIGHT HANDLE (MISCELLANEOUS) ×2 IMPLANT
DRAPE INCISE IOBAN 66X45 STRL (DRAPES) ×2 IMPLANT
DRAPE U-SHAPE 47X51 STRL (DRAPES) ×2 IMPLANT
DRESSING VERAFLO CLEANS CC MED (GAUZE/BANDAGES/DRESSINGS) IMPLANT
DRSG ADAPTIC 3X8 NADH LF (GAUZE/BANDAGES/DRESSINGS) IMPLANT
DRSG PAD ABDOMINAL 8X10 ST (GAUZE/BANDAGES/DRESSINGS) IMPLANT
DRSG VERAFLO CLEANSE CC MED (GAUZE/BANDAGES/DRESSINGS) ×2
DURAPREP 26ML APPLICATOR (WOUND CARE) ×2 IMPLANT
ELECT REM PT RETURN 9FT ADLT (ELECTROSURGICAL) ×2
ELECTRODE REM PT RTRN 9FT ADLT (ELECTROSURGICAL) ×1 IMPLANT
GAUZE SPONGE 4X4 12PLY STRL (GAUZE/BANDAGES/DRESSINGS) IMPLANT
GLOVE BIOGEL PI IND STRL 9 (GLOVE) ×1 IMPLANT
GLOVE BIOGEL PI INDICATOR 9 (GLOVE) ×2
GLOVE SURG ORTHO 9.0 STRL STRW (GLOVE) ×2 IMPLANT
GOWN STRL REUS W/ TWL XL LVL3 (GOWN DISPOSABLE) ×3 IMPLANT
GOWN STRL REUS W/TWL XL LVL3 (GOWN DISPOSABLE) ×6
GRAFT SKIN WND MICRO 38 (Tissue) ×1 IMPLANT
GRAFT SKIN WND OMEGA3 SB 7X10 (Tissue) ×1 IMPLANT
KIT BASIN OR (CUSTOM PROCEDURE TRAY) ×2 IMPLANT
KIT TURNOVER KIT B (KITS) ×2 IMPLANT
NS IRRIG 1000ML POUR BTL (IV SOLUTION) ×2 IMPLANT
PACK ORTHO EXTREMITY (CUSTOM PROCEDURE TRAY) ×2 IMPLANT
PAD ARMBOARD 7.5X6 YLW CONV (MISCELLANEOUS) ×4 IMPLANT
SPONGE T-LAP 18X18 ~~LOC~~+RFID (SPONGE) IMPLANT
SUT ETHILON 2 0 PSLX (SUTURE) ×4 IMPLANT
TOWEL GREEN STERILE (TOWEL DISPOSABLE) ×2 IMPLANT
TOWEL GREEN STERILE FF (TOWEL DISPOSABLE) ×2 IMPLANT
TUBE CONNECTING 12X1/4 (SUCTIONS) ×2 IMPLANT
WATER STERILE IRR 1000ML POUR (IV SOLUTION) ×2 IMPLANT
YANKAUER SUCT BULB TIP NO VENT (SUCTIONS) ×2 IMPLANT

## 2021-09-21 NOTE — Anesthesia Preprocedure Evaluation (Signed)
Anesthesia Evaluation  Patient identified by MRN, date of birth, ID band Patient awake    Reviewed: Allergy & Precautions, NPO status , Patient's Chart, lab work & pertinent test results  History of Anesthesia Complications Negative for: history of anesthetic complications  Airway Mallampati: II  TM Distance: >3 FB Neck ROM: Full    Dental  (+) Poor Dentition, Missing, Chipped, Dental Advisory Given   Pulmonary Current Smoker and Patient abstained from smoking.,    breath sounds clear to auscultation       Cardiovascular negative cardio ROS   Rhythm:Regular Rate:Normal     Neuro/Psych negative neurological ROS     GI/Hepatic negative GI ROS, (+)     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,   Endo/Other  negative endocrine ROS  Renal/GU negative Renal ROS     Musculoskeletal  (+) narcotic dependent  Abdominal   Peds  Hematology negative hematology ROS (+)   Anesthesia Other Findings   Reproductive/Obstetrics                             Anesthesia Physical Anesthesia Plan  ASA: 2  Anesthesia Plan: General   Post-op Pain Management: Regional block* and Toradol IV (intra-op)*   Induction: Intravenous  PONV Risk Score and Plan: 2 and Ondansetron and Dexamethasone  Airway Management Planned: LMA  Additional Equipment: None  Intra-op Plan:   Post-operative Plan:   Informed Consent: I have reviewed the patients History and Physical, chart, labs and discussed the procedure including the risks, benefits and alternatives for the proposed anesthesia with the patient or authorized representative who has indicated his/her understanding and acceptance.     Dental advisory given  Plan Discussed with: CRNA and Surgeon  Anesthesia Plan Comments: (Plan routine monitors, GA with adductor canal and popliteal blocks for post op analgesia)        Anesthesia Quick  Evaluation

## 2021-09-21 NOTE — Transfer of Care (Signed)
Immediate Anesthesia Transfer of Care Note  Patient: Kaylee Ward  Procedure(s) Performed: RIGHT FOOT TRANSMETATARSAL AMPUTATION AND SKIN GRAFT (Right: Foot)  Patient Location: PACU  Anesthesia Type:General  Level of Consciousness: awake, drowsy and patient cooperative  Airway & Oxygen Therapy: Patient Spontanous Breathing  Post-op Assessment: Report given to RN and Post -op Vital signs reviewed and stable  Post vital signs: Reviewed and stable  Last Vitals:  Vitals Value Taken Time  BP 137/93 09/21/21 1702  Temp    Pulse 77 09/21/21 1703  Resp 10 09/21/21 1703  SpO2 100 % 09/21/21 1703  Vitals shown include unvalidated device data.  Last Pain:  Vitals:   09/21/21 1555  TempSrc:   PainSc: 0-No pain      Patients Stated Pain Goal: 0 (09/19/21 0748)  Complications: No notable events documented.

## 2021-09-21 NOTE — Anesthesia Procedure Notes (Signed)
Procedure Name: LMA Insertion Date/Time: 09/21/2021 4:13 PM Performed by: Audie Pinto, CRNA Pre-anesthesia Checklist: Patient identified, Emergency Drugs available, Suction available and Patient being monitored Patient Re-evaluated:Patient Re-evaluated prior to induction Oxygen Delivery Method: Circle system utilized Preoxygenation: Pre-oxygenation with 100% oxygen Induction Type: IV induction LMA: LMA inserted LMA Size: 3.0 Placement Confirmation: positive ETCO2 Dental Injury: Teeth and Oropharynx as per pre-operative assessment

## 2021-09-21 NOTE — Op Note (Signed)
09/21/2021  5:17 PM  PATIENT:  Kaylee Ward    PRE-OPERATIVE DIAGNOSIS:  Traumatic Right Foot Amputation  POST-OPERATIVE DIAGNOSIS:  Same  PROCEDURE:  RIGHT FOOT TRANSMETATARSAL AMPUTATION LOCAL TISSUE REARRANGEMENT FOR WOUND CLOSURE 10 X 10 CM. APPLICATION OF KERECIS BIOLOGIC SKIN GRAFT 70 CM SHEET AND 38 CM MICRO POWDER.   APPLICATION OF CLEANSE CHOICE WOUND VAC PRAVEENA SPONGE.    SURGEON:  Nadara Mustard, MD  PHYSICIAN ASSISTANT:None ANESTHESIA:   General  PREOPERATIVE INDICATIONS:  Kaylee Ward is a  31 y.o. female with a diagnosis of Traumatic Right Foot Amputation who failed conservative measures and elected for surgical management.    The risks benefits and alternatives were discussed with the patient preoperatively including but not limited to the risks of infection, bleeding, nerve injury, cardiopulmonary complications, the need for revision surgery, among others, and the patient was willing to proceed.  OPERATIVE IMPLANTS: Kerecis tissue graft 7 x 10 cm and 38 cm micro powder.  @ENCIMAGES @  OPERATIVE FINDINGS: Tissue was debrided back to healthy viable margins.  OPERATIVE PROCEDURE: Patient was brought the operating room and underwent a general anesthetic after regional block.  After adequate levels anesthesia obtained patient's right lower extremity was prepped using DuraPrep draped into a sterile field a timeout was called.  A fishmouth incision was made just proximal to the injured traumatic tissue this was carried sharply down to bone.  A oscillating saw was used to resect the remaining metatarsals and a gentle cascade this was a resection approximately 1 cm of bone.  Electrocautery was used for hemostasis the wound was irrigated with normal saline.  The 38 cm Kerecis micro powder was applied to the wound.  This was covered by the 7 x 10 cm sheet.  The incisions were extended and local tissue rearrangement was used to close the wound over the tissue graft.  This  closed a wound partially that was 10 x 10 cm.  A cleanse choice wound VAC sponge was applied this has a good suction fit this was overwrapped with Coban patient was extubated taken the PACU in stable condition.   DISCHARGE PLANNING:  Antibiotic duration: Continue IV antibiotics for 24 hours  Weightbearing: Nonweightbearing on the right  Pain medication: Opioid pathway  Dressing care/ Wound VAC: Continue wound VAC for 1 week  Ambulatory devices: Walker or crutches  Discharge to: Anticipate discharge to home with her mother.  Follow-up: In the office 1 week post operative.

## 2021-09-21 NOTE — Anesthesia Procedure Notes (Signed)
Anesthesia Regional Block: Popliteal block   Pre-Anesthetic Checklist: , timeout performed,  Correct Patient, Correct Site, Correct Laterality,  Correct Procedure, Correct Position, site marked,  Risks and benefits discussed,  Surgical consent,  Pre-op evaluation,  At surgeon's request and post-op pain management  Laterality: Right and Lower  Prep: chloraprep       Needles:  Injection technique: Single-shot  Needle Type: Echogenic Needle     Needle Length: 9cm  Needle Gauge: 21     Additional Needles:   Procedures:,,,, ultrasound used (permanent image in chart),,    Narrative:  Start time: 09/21/2021 3:34 PM End time: 09/21/2021 3:39 PM Injection made incrementally with aspirations every 5 mL.  Performed by: Personally  Anesthesiologist: Jairo Ben, MD  Additional Notes: Pt identified in Holding room.  Monitors applied. Working IV access confirmed. Sterile prep R lateral knee/distal thigh.  #21ga ECHOgenic Arrow block needle to sciatic nerve in pop fossa with US guidance.  20cc 0.5% Bupivacaine !:200k epi injected incrementally after negative test dose.  Patient asymptomatic, VSS, no heme aspirated, tolerated well.   Sandford Craze, MD

## 2021-09-21 NOTE — Progress Notes (Signed)
Subjective: Patient reports a lot of back pain and feeling very nauseous  Objective: Vital signs in last 24 hours: Temp:  [98 F (36.7 C)-98.3 F (36.8 C)] 98.1 F (36.7 C) (06/07 0720) Pulse Rate:  [51-69] 51 (06/07 0449) Resp:  [11-18] 11 (06/07 0449) BP: (115-124)/(79-93) 115/80 (06/07 0320) SpO2:  [97 %-100 %] 98 % (06/07 0449)  Intake/Output from previous day: 06/06 0701 - 06/07 0700 In: -  Out: 1400 [Urine:1400] Intake/Output this shift: No intake/output data recorded.  Neurologic: Grossly normal  Lab Results: Lab Results  Component Value Date   WBC 11.3 (H) 09/21/2021   HGB 9.2 (L) 09/21/2021   HCT 26.9 (L) 09/21/2021   MCV 92.1 09/21/2021   PLT 148 (L) 09/21/2021   Lab Results  Component Value Date   INR 1.1 09/17/2021   BMET Lab Results  Component Value Date   NA 136 09/21/2021   K 3.3 (L) 09/21/2021   CL 105 09/21/2021   CO2 21 (L) 09/21/2021   GLUCOSE 153 (H) 09/21/2021   BUN 12 09/21/2021   CREATININE 0.91 09/21/2021   CALCIUM 7.9 (L) 09/21/2021    Studies/Results: No results found.  Assessment/Plan: S/p anterior superior endplate fracture of L5 with TP fx on the right at L3 and L4. Continue to mobilize in brace as tolerated.    LOS: 4 days    Tiana Loft [facility] 09/21/2021, 10:58 AM

## 2021-09-21 NOTE — Anesthesia Procedure Notes (Signed)
Anesthesia Regional Block: Adductor canal block   Pre-Anesthetic Checklist: , timeout performed,  Correct Patient, Correct Site, Correct Laterality,  Correct Procedure, Correct Position, site marked,  Risks and benefits discussed,  Surgical consent,  Pre-op evaluation,  At surgeon's request and post-op pain management  Laterality: Right and Lower  Prep: chloraprep       Needles:  Injection technique: Single-shot  Needle Type: Echogenic Needle     Needle Length: 9cm  Needle Gauge: 21     Additional Needles:   Procedures:,,,, ultrasound used (permanent image in chart),,    Narrative:  Start time: 09/21/2021 3:26 PM End time: 09/21/2021 3:32 PM Injection made incrementally with aspirations every 5 mL.  Performed by: Personally  Anesthesiologist: Jairo Ben, MD  Additional Notes: Pt identified in Holding room.  Monitors applied. Working IV access confirmed. Sterile prep R thigh.  #21ga ECHOgenic Arrow block needle into adductor canal with US guidance.  10cc 0.5% Bupivacaine 1:200k epi injected incrementally after negative test dose.  Patient asymptomatic, VSS, no heme aspirated, tolerated well.   Sandford Craze, MD
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
## 2021-09-21 NOTE — Progress Notes (Signed)
Returned from PACU/OR in bed, no obvious distress. Arousable to voice, follows commands, alert. Describes pain 8/10. Currently sedated, eyes shut, but opens them when talking. RLE dressed with post op bandage, wound vac in place.

## 2021-09-21 NOTE — Progress Notes (Signed)
Orthopedic Tech Progress Note Patient Details:  Kaylee Ward [birth year] [id number]  Patient ID: Kaylee Ward, female   DOB: Aug 23, 1990, 31 y.o.   MRN: [id number] POS dropped off in patients room.  Darleen Crocker 09/21/2021, 8:25 PM

## 2021-09-21 NOTE — Anesthesia Postprocedure Evaluation (Signed)
Anesthesia Post Note  Patient: Kaylee Ward  Procedure(s) Performed: RIGHT FOOT TRANSMETATARSAL AMPUTATION AND SKIN GRAFT (Right: Foot)     Patient location during evaluation: PACU Anesthesia Type: General Level of consciousness: awake and alert, patient cooperative and oriented Pain management: pain level controlled (pain improving) Vital Signs Assessment: post-procedure vital signs reviewed and stable Respiratory status: spontaneous breathing, nonlabored ventilation and respiratory function stable Cardiovascular status: blood pressure returned to baseline and stable Postop Assessment: no apparent nausea or vomiting Anesthetic complications: no   No notable events documented.  Last Vitals:  Vitals:   09/21/21 1705 09/21/21 1720  BP: (!) 137/93 (!) 133/98  Pulse: 80 76  Resp: 10 11  Temp: 36.9 C   SpO2: 99% 100%    Last Pain:  Vitals:   09/21/21 1705  TempSrc:   PainSc: 10-Worst pain ever                 [name],E. Zuriel Roskos

## 2021-09-21 NOTE — Progress Notes (Signed)
Inpatient Rehab Admissions Coordinator:    I do not have a bed for this Pt. Today. She is to return to OR with ortho today. I will follow up afterwards.  Megan Salon, MS, CCC-SLP Rehab Admissions Coordinator  [phone] (celll) [phone] (office)

## 2021-09-21 NOTE — Interval H&P Note (Signed)
History and Physical Interval Note:  09/21/2021 6:45 AM  Kaylee Ward  has presented today for surgery, with the diagnosis of Traumatic Right Foot Amputation.  The various methods of treatment have been discussed with the patient and family. After consideration of risks, benefits and other options for treatment, the patient has consented to  Procedure(s): RIGHT FOOT TRANSMETATARSAL AMPUTATION AND SKIN GRAFT (Right) as a surgical intervention.  The patient's history has been reviewed, patient examined, no change in status, stable for surgery.  I have reviewed the patient's chart and labs.  Questions were answered to the patient's satisfaction.     Newt Minion

## 2021-09-22 ENCOUNTER — Encounter (HOSPITAL_COMMUNITY): Payer: Self-pay | Admitting: Orthopedic Surgery

## 2021-09-22 LAB — CBC
HCT: 23.5 % — ABNORMAL LOW (ref 36.0–46.0)
Hemoglobin: 8 g/dL — ABNORMAL LOW (ref 12.0–15.0)
MCH: 31.4 pg (ref 26.0–34.0)
MCHC: 34 g/dL (ref 30.0–36.0)
MCV: 92.2 fL (ref 80.0–100.0)
Platelets: 149 10*3/uL — ABNORMAL LOW (ref 150–400)
RBC: 2.55 MIL/uL — ABNORMAL LOW (ref 3.87–5.11)
RDW: 12.3 % (ref 11.5–15.5)
WBC: 11.4 10*3/uL — ABNORMAL HIGH (ref 4.0–10.5)
nRBC: 0 % (ref 0.0–0.2)

## 2021-09-22 LAB — BASIC METABOLIC PANEL
Anion gap: 4 — ABNORMAL LOW (ref 5–15)
BUN: 16 mg/dL (ref 6–20)
CO2: 23 mmol/L (ref 22–32)
Calcium: 7.5 mg/dL — ABNORMAL LOW (ref 8.9–10.3)
Chloride: 109 mmol/L (ref 98–111)
Creatinine, Ser: 0.82 mg/dL (ref 0.44–1.00)
GFR, Estimated: >60 mL/min (ref >60)
Glucose, Bld: 118 mg/dL — ABNORMAL HIGH (ref 70–99)
Potassium: 3.4 mmol/L — ABNORMAL LOW (ref 3.5–5.1)
Sodium: 136 mmol/L (ref 135–145)

## 2021-09-22 MED ORDER — CALCIUM CARBONATE ANTACID 500 MG PO CHEW
2.0000 | CHEWABLE_TABLET | Freq: Three times a day (TID) | ORAL | Status: DC
  Administered 2021-09-22: 400 mg via ORAL
  Filled 2021-09-22 (×3): qty 2

## 2021-09-22 MED ORDER — GABAPENTIN 600 MG PO TABS
600.0000 mg | ORAL_TABLET | Freq: Three times a day (TID) | ORAL | Status: DC
  Administered 2021-09-22 – 2021-09-24 (×6): 600 mg via ORAL
  Filled 2021-09-22 (×7): qty 1

## 2021-09-22 MED ORDER — HYDROMORPHONE HCL 1 MG/ML IJ SOLN
0.5000 mg | Freq: Four times a day (QID) | INTRAMUSCULAR | Status: DC | PRN
  Administered 2021-09-22 – 2021-09-23 (×3): 1 mg via INTRAVENOUS
  Filled 2021-09-22 (×3): qty 1

## 2021-09-22 MED ORDER — POTASSIUM CHLORIDE CRYS ER 20 MEQ PO TBCR
40.0000 meq | EXTENDED_RELEASE_TABLET | Freq: Two times a day (BID) | ORAL | Status: AC
Start: 2021-09-22 — End: 2021-09-22
  Administered 2021-09-22 (×2): 40 meq via ORAL
  Filled 2021-09-22 (×2): qty 2

## 2021-09-22 NOTE — Progress Notes (Signed)
Patient ID: Kaylee Ward, female   DOB: 01-01-1991, 31 y.o.   MRN: [id number] Patient is postop day 1 revision transmetatarsal amputation with application of Kerecis powder and tissue graft and a wound VAC.  Patient has 100 cc in the wound VAC canister.  Anticipate patient can discharge to home when she is safe with therapy nonweightbearing on the right foot.  She will discharge with the Praveena plus portable wound VAC pump this will remain intact for 1 week after discharge.  I will follow-up in the office at that time for wound VAC change.
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
## 2021-09-22 NOTE — Progress Notes (Signed)
IP rehab admissions - met with patient and mom at the bedside.  Patient still with a lot of pain and worried about not having IV pain meds more often than for break through pain.  Did have IV meds this am.  Not ready for CIR today.  Will have my partner follow up tomorrow for medical readiness.  Call for questions.  [phone]
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
## 2021-09-23 LAB — BASIC METABOLIC PANEL
Anion gap: 6 (ref 5–15)
BUN: 13 mg/dL (ref 6–20)
CO2: 23 mmol/L (ref 22–32)
Calcium: 7.7 mg/dL — ABNORMAL LOW (ref 8.9–10.3)
Chloride: 105 mmol/L (ref 98–111)
Creatinine, Ser: 0.76 mg/dL (ref 0.44–1.00)
GFR, Estimated: >60 mL/min (ref >60)
Glucose, Bld: 157 mg/dL — ABNORMAL HIGH (ref 70–99)
Potassium: 3.5 mmol/L (ref 3.5–5.1)
Sodium: 134 mmol/L — ABNORMAL LOW (ref 135–145)

## 2021-09-23 LAB — CBC
HCT: 24.2 % — ABNORMAL LOW (ref 36.0–46.0)
Hemoglobin: 8.2 g/dL — ABNORMAL LOW (ref 12.0–15.0)
MCH: 31.2 pg (ref 26.0–34.0)
MCHC: 33.9 g/dL (ref 30.0–36.0)
MCV: 92 fL (ref 80.0–100.0)
Platelets: 167 10*3/uL (ref 150–400)
RBC: 2.63 MIL/uL — ABNORMAL LOW (ref 3.87–5.11)
RDW: 12 % (ref 11.5–15.5)
WBC: 9.9 10*3/uL (ref 4.0–10.5)
nRBC: 0 % (ref 0.0–0.2)

## 2021-09-23 MED ORDER — TRAMADOL HCL 50 MG PO TABS
50.0000 mg | ORAL_TABLET | Freq: Four times a day (QID) | ORAL | Status: DC
  Administered 2021-09-23 – 2021-09-24 (×6): 50 mg via ORAL
  Filled 2021-09-23 (×6): qty 1

## 2021-09-23 MED ORDER — HYDROMORPHONE HCL 1 MG/ML IJ SOLN: 0.5000 mg | Freq: Three times a day (TID) | INTRAMUSCULAR | Status: DC | PRN

## 2021-09-23 MED ORDER — HYDROMORPHONE HCL 1 MG/ML IJ SOLN
0.5000 mg | Freq: Three times a day (TID) | INTRAMUSCULAR | Status: DC | PRN
  Administered 2021-09-23 – 2021-09-24 (×3): 0.5 mg via INTRAVENOUS
  Filled 2021-09-23 (×3): qty 0.5
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
## 2021-09-23 NOTE — Progress Notes (Signed)
OT Cancellation Note  Patient Details Name: Kaylee Ward MRN: [id number] DOB: 28-Jun-1990   Cancelled Treatment:    Reason Eval/Treat Not Completed: Other (comment)- pt declined due to nausea, just received medication.  Will check back as able.   Jolaine Artist, OT Acute Rehabilitation Services Office [place] 09/23/2021, 12:16 PM
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
## 2021-09-23 NOTE — Plan of Care (Signed)

## 2021-09-24 ENCOUNTER — Inpatient Hospital Stay (HOSPITAL_COMMUNITY): Payer: Medicaid Other

## 2021-09-24 ENCOUNTER — Other Ambulatory Visit: Payer: Self-pay

## 2021-09-24 ENCOUNTER — Inpatient Hospital Stay (HOSPITAL_COMMUNITY)
Admission: RE | Admit: 2021-09-24 | Discharge: 2021-10-03 | DRG: 560 | Disposition: A | Discharge status: Home / Self Care | Payer: Medicaid Other | Source: Intra-hospital | Attending: Physical Medicine & Rehabilitation | Admitting: Physical Medicine & Rehabilitation

## 2021-09-24 ENCOUNTER — Encounter (HOSPITAL_COMMUNITY): Payer: Self-pay | Admitting: Physical Medicine and Rehabilitation

## 2021-09-24 DIAGNOSIS — S32401D Unspecified fracture of right acetabulum, subsequent encounter for fracture with routine healing: Secondary | ICD-10-CM | POA: Diagnosis present

## 2021-09-24 DIAGNOSIS — Z4781 Encounter for orthopedic aftercare following surgical amputation: Principal | ICD-10-CM

## 2021-09-24 DIAGNOSIS — S98131A Complete traumatic amputation of one right lesser toe, initial encounter: Secondary | ICD-10-CM | POA: Diagnosis not present

## 2021-09-24 DIAGNOSIS — R339 Retention of urine, unspecified: Secondary | ICD-10-CM | POA: Diagnosis present

## 2021-09-24 DIAGNOSIS — F1210 Cannabis abuse, uncomplicated: Secondary | ICD-10-CM | POA: Diagnosis present

## 2021-09-24 DIAGNOSIS — Z89421 Acquired absence of other right toe(s): Secondary | ICD-10-CM | POA: Diagnosis not present

## 2021-09-24 DIAGNOSIS — F1510 Other stimulant abuse, uncomplicated: Secondary | ICD-10-CM | POA: Diagnosis present

## 2021-09-24 DIAGNOSIS — F1910 Other psychoactive substance abuse, uncomplicated: Secondary | ICD-10-CM | POA: Diagnosis present

## 2021-09-24 DIAGNOSIS — Z560 Unemployment, unspecified: Secondary | ICD-10-CM

## 2021-09-24 DIAGNOSIS — B379 Candidiasis, unspecified: Secondary | ICD-10-CM | POA: Diagnosis present

## 2021-09-24 DIAGNOSIS — D72829 Elevated white blood cell count, unspecified: Secondary | ICD-10-CM | POA: Diagnosis not present

## 2021-09-24 DIAGNOSIS — R2689 Other abnormalities of gait and mobility: Secondary | ICD-10-CM | POA: Diagnosis present

## 2021-09-24 DIAGNOSIS — F17210 Nicotine dependence, cigarettes, uncomplicated: Secondary | ICD-10-CM | POA: Diagnosis present

## 2021-09-24 DIAGNOSIS — S32058S Other fracture of fifth lumbar vertebra, sequela: Secondary | ICD-10-CM | POA: Diagnosis not present

## 2021-09-24 DIAGNOSIS — M79671 Pain in right foot: Secondary | ICD-10-CM | POA: Diagnosis present

## 2021-09-24 DIAGNOSIS — S32039D Unspecified fracture of third lumbar vertebra, subsequent encounter for fracture with routine healing: Secondary | ICD-10-CM | POA: Diagnosis not present

## 2021-09-24 DIAGNOSIS — S91301S Unspecified open wound, right foot, sequela: Secondary | ICD-10-CM

## 2021-09-24 DIAGNOSIS — M7989 Other specified soft tissue disorders: Secondary | ICD-10-CM | POA: Diagnosis not present

## 2021-09-24 DIAGNOSIS — Y92410 Unspecified street and highway as the place of occurrence of the external cause: Secondary | ICD-10-CM | POA: Diagnosis not present

## 2021-09-24 DIAGNOSIS — S32009D Unspecified fracture of unspecified lumbar vertebra, subsequent encounter for fracture with routine healing: Secondary | ICD-10-CM | POA: Diagnosis not present

## 2021-09-24 DIAGNOSIS — S32049D Unspecified fracture of fourth lumbar vertebra, subsequent encounter for fracture with routine healing: Secondary | ICD-10-CM

## 2021-09-24 DIAGNOSIS — D62 Acute posthemorrhagic anemia: Secondary | ICD-10-CM | POA: Diagnosis present

## 2021-09-24 DIAGNOSIS — T07XXXA Unspecified multiple injuries, initial encounter: Secondary | ICD-10-CM | POA: Diagnosis not present

## 2021-09-24 DIAGNOSIS — F1110 Opioid abuse, uncomplicated: Secondary | ICD-10-CM | POA: Diagnosis present

## 2021-09-24 MED ORDER — DEXAMETHASONE 2 MG PO TABS: 10.0000 mg | ORAL_TABLET | Freq: Four times a day (QID) | ORAL | Status: DC

## 2021-09-24 MED ORDER — ENOXAPARIN SODIUM 30 MG/0.3ML IJ SOSY
30.0000 mg | PREFILLED_SYRINGE | Freq: Two times a day (BID) | INTRAMUSCULAR | Status: DC
  Administered 2021-09-24 – 2021-10-02 (×16): 30 mg via SUBCUTANEOUS
  Filled 2021-09-24 (×16): qty 0.3

## 2021-09-24 MED ORDER — OXYCODONE HCL ER 15 MG PO T12A
30.0000 mg | EXTENDED_RELEASE_TABLET | Freq: Two times a day (BID) | ORAL | Status: DC
  Administered 2021-09-24 – 2021-10-02 (×16): 30 mg via ORAL
  Filled 2021-09-24 (×16): qty 2

## 2021-09-24 MED ORDER — DOCUSATE SODIUM 100 MG PO CAPS
100.0000 mg | ORAL_CAPSULE | Freq: Two times a day (BID) | ORAL | Status: DC
  Administered 2021-09-25 – 2021-09-27 (×3): 100 mg via ORAL
  Filled 2021-09-24 (×12): qty 1

## 2021-09-24 MED ORDER — BACITRACIN ZINC 500 UNIT/GM EX OINT
1.0000 "application " | TOPICAL_OINTMENT | Freq: Two times a day (BID) | CUTANEOUS | Status: DC
  Administered 2021-09-24 – 2021-10-03 (×17): 1 via TOPICAL
  Filled 2021-09-24: qty 28.4

## 2021-09-24 MED ORDER — NYSTATIN 100000 UNIT/ML MT SUSP
5.0000 mL | Freq: Four times a day (QID) | OROMUCOSAL | Status: AC
  Administered 2021-09-24 – 2021-09-27 (×11): 500000 [IU] via ORAL
  Filled 2021-09-24 (×12): qty 5

## 2021-09-24 MED ORDER — ENOXAPARIN SODIUM 30 MG/0.3ML IJ SOSY: 30.0000 mg | PREFILLED_SYRINGE | Freq: Two times a day (BID) | INTRAMUSCULAR | Status: DC

## 2021-09-24 MED ORDER — BISACODYL 10 MG RE SUPP: 10.0000 mg | Freq: Every day | RECTAL | Status: DC | PRN

## 2021-09-24 MED ORDER — TRAMADOL HCL 50 MG PO TABS
50.0000 mg | ORAL_TABLET | Freq: Four times a day (QID) | ORAL | Status: DC
  Administered 2021-09-24 – 2021-10-03 (×33): 50 mg via ORAL
  Filled 2021-09-24 (×33): qty 1

## 2021-09-24 MED ORDER — METHOCARBAMOL 1000 MG/10ML IJ SOLN
1000.0000 mg | Freq: Four times a day (QID) | INTRAVENOUS | Status: DC
  Filled 2021-09-24: qty 10

## 2021-09-24 MED ORDER — ZINC SULFATE 220 (50 ZN) MG PO CAPS
220.0000 mg | ORAL_CAPSULE | Freq: Every day | ORAL | Status: DC
  Administered 2021-09-25 – 2021-10-03 (×9): 220 mg via ORAL
  Filled 2021-09-24 (×9): qty 1

## 2021-09-24 MED ORDER — BOOST / RESOURCE BREEZE PO LIQD CUSTOM: 1.0000 | Freq: Three times a day (TID) | ORAL | Status: DC

## 2021-09-24 MED ORDER — ADULT MULTIVITAMIN W/MINERALS CH
1.0000 | ORAL_TABLET | Freq: Every day | ORAL | Status: DC
  Administered 2021-09-25 – 2021-10-03 (×9): 1 via ORAL
  Filled 2021-09-24 (×9): qty 1

## 2021-09-24 MED ORDER — ACETAMINOPHEN 325 MG PO TABS: 325.0000 mg | ORAL_TABLET | Freq: Four times a day (QID) | ORAL | Status: DC | PRN

## 2021-09-24 MED ORDER — FERROUS SULFATE 325 (65 FE) MG PO TABS
325.0000 mg | ORAL_TABLET | Freq: Two times a day (BID) | ORAL | Status: DC
  Administered 2021-09-24 – 2021-10-03 (×18): 325 mg via ORAL
  Filled 2021-09-24 (×18): qty 1

## 2021-09-24 MED ORDER — OXYCODONE HCL 5 MG PO TABS
5.0000 mg | ORAL_TABLET | ORAL | Status: DC | PRN
  Administered 2021-09-24 – 2021-10-03 (×27): 10 mg via ORAL
  Filled 2021-09-24 (×28): qty 2

## 2021-09-24 MED ORDER — JUVEN PO PACK
1.0000 | PACK | Freq: Two times a day (BID) | ORAL | Status: DC
  Filled 2021-09-24 (×2): qty 1

## 2021-09-24 MED ORDER — BISACODYL 5 MG PO TBEC: 5.0000 mg | DELAYED_RELEASE_TABLET | Freq: Every day | ORAL | Status: DC | PRN

## 2021-09-24 MED ORDER — HYDROMORPHONE HCL 2 MG PO TABS
2.0000 mg | ORAL_TABLET | Freq: Three times a day (TID) | ORAL | Status: DC | PRN
  Administered 2021-09-24 – 2021-10-01 (×8): 2 mg via ORAL
  Filled 2021-09-24 (×8): qty 1

## 2021-09-24 MED ORDER — ASCORBIC ACID 500 MG PO TABS
1000.0000 mg | ORAL_TABLET | Freq: Every day | ORAL | Status: DC
  Administered 2021-09-25 – 2021-10-03 (×9): 1000 mg via ORAL
  Filled 2021-09-24 (×10): qty 2

## 2021-09-24 MED ORDER — LIDOCAINE 5 % EX PTCH
1.0000 | MEDICATED_PATCH | Freq: Every day | CUTANEOUS | Status: DC
  Administered 2021-09-25 – 2021-10-03 (×9): 1 via TRANSDERMAL
  Filled 2021-09-24 (×9): qty 1

## 2021-09-24 MED ORDER — DEXAMETHASONE 4 MG PO TABS
10.0000 mg | ORAL_TABLET | Freq: Three times a day (TID) | ORAL | Status: DC
  Administered 2021-09-24 – 2021-09-27 (×8): 10 mg via ORAL
  Filled 2021-09-24 (×8): qty 1

## 2021-09-24 MED ORDER — CALCIUM CARBONATE ANTACID 500 MG PO CHEW
2.0000 | CHEWABLE_TABLET | Freq: Three times a day (TID) | ORAL | Status: DC
  Filled 2021-09-24 (×6): qty 2

## 2021-09-24 MED ORDER — GABAPENTIN 600 MG PO TABS
600.0000 mg | ORAL_TABLET | Freq: Three times a day (TID) | ORAL | Status: DC
  Administered 2021-09-24 – 2021-09-26 (×6): 600 mg via ORAL
  Filled 2021-09-24 (×6): qty 1

## 2021-09-24 MED ORDER — BETHANECHOL CHLORIDE 10 MG PO TABS
10.0000 mg | ORAL_TABLET | Freq: Three times a day (TID) | ORAL | Status: AC
  Administered 2021-09-24 (×2): 10 mg via ORAL
  Filled 2021-09-24 (×2): qty 1

## 2021-09-24 MED ORDER — PANTOPRAZOLE SODIUM 40 MG PO TBEC
40.0000 mg | DELAYED_RELEASE_TABLET | Freq: Two times a day (BID) | ORAL | Status: DC
  Administered 2021-09-24 – 2021-10-03 (×18): 40 mg via ORAL
  Filled 2021-09-24 (×17): qty 1

## 2021-09-24 MED ORDER — POLYETHYLENE GLYCOL 3350 17 G PO PACK
17.0000 g | PACK | Freq: Every day | ORAL | Status: DC
  Filled 2021-09-24 (×2): qty 1

## 2021-09-24 MED ORDER — MUPIROCIN 2 % EX OINT
1.0000 "application " | TOPICAL_OINTMENT | Freq: Two times a day (BID) | CUTANEOUS | Status: AC
  Administered 2021-09-24 – 2021-09-25 (×2): 1 via NASAL
  Filled 2021-09-24: qty 22

## 2021-09-24 MED ORDER — ONDANSETRON 4 MG PO TBDP
4.0000 mg | ORAL_TABLET | Freq: Four times a day (QID) | ORAL | Status: DC | PRN
  Administered 2021-09-26 (×2): 4 mg via ORAL
  Filled 2021-09-24 (×2): qty 1

## 2021-09-24 MED ORDER — METHOCARBAMOL 500 MG PO TABS
1000.0000 mg | ORAL_TABLET | Freq: Four times a day (QID) | ORAL | Status: DC
  Administered 2021-09-24 – 2021-10-03 (×35): 1000 mg via ORAL
  Filled 2021-09-24 (×36): qty 2

## 2021-09-24 MED ORDER — ONDANSETRON HCL 4 MG/2ML IJ SOLN: 4.0000 mg | Freq: Four times a day (QID) | INTRAMUSCULAR | Status: DC | PRN

## 2021-09-24 NOTE — Progress Notes (Addendum)
Inpatient Rehabilitation Admission Medication Review by a Pharmacist  A complete drug regimen review was completed for this patient to identify any potential clinically significant medication issues.  High Risk Drug Classes Is patient taking? Indication by Medication  Antipsychotic No   Anticoagulant Yes Lovenox for DVT px  Antibiotic No   Opioid Yes Oxycodone/tramadol - pain  Antiplatelet No   Hypoglycemics/insulin No   Vasoactive Medication No   Chemotherapy No   Other Yes Nystatin - oral candidiasis Protonix - reflux Robaxin - spasms Neurontin  - neuropathy Bethanacol - urinary spasm     Type of Medication Issue Identified Description of Issue Recommendation(s)  Drug Interaction(s) (clinically significant)     Duplicate Therapy     Allergy     No Medication Administration End Date     Incorrect Dose     Additional Drug Therapy Needed     Significant med changes from prior encounter (inform family/care partners about these prior to discharge).    Other       Clinically significant medication issues were identified that warrant physician communication and completion of prescribed/recommended actions by midnight of the next day:  No  Name of provider notified for urgent issues identified:   Provider Method of Notification:     Pharmacist comments:   Time spent performing this drug regimen review (minutes):  20  Ulyses Southward, PharmD, [place], AAHIVP, CPP Infectious Disease Pharmacist 09/24/2021 2:25 PM

## 2021-09-24 NOTE — Progress Notes (Signed)
VASCULAR LAB    Bilateral lower extremity venous duplex has been performed.  See CV proc for preliminary results.   Kaiyu Mirabal, RVT 09/24/2021, 3:57 PM

## 2021-09-24 NOTE — Progress Notes (Signed)
Patient admitted to 4W-20. Admission assessment performed per protocol. Patient was oriented to room/unit. Denies further questions at this time. Beverage provided. Resting recliner chair at this time. Call bell within reach.
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
## 2021-09-24 NOTE — Plan of Care (Signed)
  Problem: Pain Managment: Goal: General experience of comfort will improve 09/24/2021 1020 by Merrilyn Puma, RN Outcome: Progressing 09/24/2021 0918 by Merrilyn Puma, RN Outcome: Progressing   Problem: Safety: Goal: Ability to remain free from injury will improve 09/24/2021 1020 by Merrilyn Puma, RN Outcome: Progressing 09/24/2021 0918 by Merrilyn Puma, RN Outcome: Progressing   Problem: Skin Integrity: Goal: Risk for impaired skin integrity will decrease 09/24/2021 1020 by Merrilyn Puma, RN Outcome: Progressing 09/24/2021 0918 by Merrilyn Puma, RN Outcome: Progressing

## 2021-09-24 NOTE — Plan of Care (Signed)
  Problem: Pain Managment: Goal: General experience of comfort will improve Outcome: Progressing   Problem: Safety: Goal: Ability to remain free from injury will improve Outcome: Progressing   Problem: Skin Integrity: Goal: Risk for impaired skin integrity will decrease Outcome: Progressing   
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
## 2021-09-24 NOTE — Progress Notes (Addendum)
Report called and given to [place], Charity fundraiser on [facility]. All questions answered to satisfaction. PIV remains in place per RN request. Pt transported via wheelchair to [facility].

## 2021-09-25 DIAGNOSIS — T07XXXA Unspecified multiple injuries, initial encounter: Secondary | ICD-10-CM | POA: Diagnosis not present
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
## 2021-09-25 NOTE — Progress Notes (Signed)
Patient in tears during morning rounds. States her pain does not seem well-controlled at night. Provider notified of pain management concerns. Provided PRN dilaudid and oxycodone per orders with positive effect. Patient also expressed concern with not being able to ambulate independently and need for safety belt. Educated on safety concerns regarding ambulating without staff as wound vac and recent surgery increase her risk for falls and further injury. Patient is also requesting a grounds pass. MD aware.
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
## 2021-09-25 NOTE — Discharge Instructions (Addendum)
Inpatient Rehab Discharge Instructions  Kaylee Ward Discharge date and time: No discharge date for patient encounter.   Activities/Precautions/ Functional Status: Activity: Nonweightbearing right lower extremity x4 weeks total with posterior hip precautions/back corset when out of bed Diet: Regular Wound Care: Routine skin checks Functional status:  ___ No restrictions     ___ Walk up steps independently ___ 24/7 supervision/assistance   ___ Walk up steps with assistance ___ Intermittent supervision/assistance  ___ Bathe/dress independently ___ Walk with walker     _x__ Bathe/dress with assistance ___ Walk Independently    ___ Shower independently ___ Walk with assistance    ___ Shower with assistance ___ No alcohol     ___ Return to work/school ________    COMMUNITY REFERRALS UPON DISCHARGE:    No therapies recommended until weight bearing status changes.   Medical Equipment/Items Ordered:3in1 BSC and wheelchair; RW- to be shipped to home                                                 Agency/Supplier:[employer] [phone]  Special Instructions: No driving smoking alcohol or illicit drug use   My questions have been answered and I understand these instructions. I will adhere to these goals and the provided educational materials after my discharge from the hospital.  Patient/Caregiver Signature _______________________________ Date __________  Clinician Signature _______________________________________ Date __________  Please bring this form and your medication list with you to all your follow-up doctor's appointments.

## 2021-09-25 NOTE — Progress Notes (Signed)
Patient expressed concerns with feeling like she was being "treated like an elderly person" and felt like her freedom was restricted. Expressed to patient that the goal was for safety, not to restrict her independence. Grounds pass granted. It was stressed to patient that she may not leave the building but may go around the hospital and to the cafeteria as long as family or staff was accompanying her. She was satisfied with this and states that she understands.

## 2021-09-25 NOTE — Progress Notes (Signed)
Prefers to sleep in recliner chair. RLE elevated on 3 pillows. Wound VAC in place to right foot, minimal drainage. PRN dilaudid given at 2236 per patient's request. Complaining of right foot pain, rating pain a 9. At 0142, PRN oxy ir 10mg 's given. NSL dc'd, leaking when flushed. Bacitracin applied to multiple abrasions. Kaylee Ward A

## 2021-09-26 DIAGNOSIS — T07XXXA Unspecified multiple injuries, initial encounter: Secondary | ICD-10-CM | POA: Diagnosis not present

## 2021-09-26 LAB — CBC WITH DIFFERENTIAL/PLATELET
Abs Immature Granulocytes: 0.09 10*3/uL — ABNORMAL HIGH (ref 0.00–0.07)
Basophils Absolute: 0 10*3/uL (ref 0.0–0.1)
Basophils Relative: 0 %
Eosinophils Absolute: 0 10*3/uL (ref 0.0–0.5)
Eosinophils Relative: 0 %
HCT: 21.7 % — ABNORMAL LOW (ref 36.0–46.0)
Hemoglobin: 7.4 g/dL — ABNORMAL LOW (ref 12.0–15.0)
Immature Granulocytes: 1 %
Lymphocytes Relative: 9 %
Lymphs Abs: 1.2 10*3/uL (ref 0.7–4.0)
MCH: 31.5 pg (ref 26.0–34.0)
MCHC: 34.1 g/dL (ref 30.0–36.0)
MCV: 92.3 fL (ref 80.0–100.0)
Monocytes Absolute: 0.6 10*3/uL (ref 0.1–1.0)
Monocytes Relative: 5 %
Neutro Abs: 10.9 10*3/uL — ABNORMAL HIGH (ref 1.7–7.7)
Neutrophils Relative %: 85 %
Platelets: 287 10*3/uL (ref 150–400)
RBC: 2.35 MIL/uL — ABNORMAL LOW (ref 3.87–5.11)
RDW: 13.2 % (ref 11.5–15.5)
WBC: 12.8 10*3/uL — ABNORMAL HIGH (ref 4.0–10.5)
nRBC: 0 % (ref 0.0–0.2)

## 2021-09-26 LAB — URINALYSIS, ROUTINE W REFLEX MICROSCOPIC
Bilirubin Urine: NEGATIVE
Glucose, UA: NEGATIVE mg/dL
Hgb urine dipstick: NEGATIVE
Ketones, ur: NEGATIVE mg/dL
Leukocytes,Ua: NEGATIVE
Nitrite: NEGATIVE
Protein, ur: NEGATIVE mg/dL
Specific Gravity, Urine: 1.016 (ref 1.005–1.030)
pH: 8 (ref 5.0–8.0)

## 2021-09-26 LAB — COMPREHENSIVE METABOLIC PANEL
ALT: 61 U/L — ABNORMAL HIGH (ref 0–44)
AST: 38 U/L (ref 15–41)
Albumin: 3.1 g/dL — ABNORMAL LOW (ref 3.5–5.0)
Alkaline Phosphatase: 42 U/L (ref 38–126)
Anion gap: 9 (ref 5–15)
BUN: 13 mg/dL (ref 6–20)
CO2: 28 mmol/L (ref 22–32)
Calcium: 8.5 mg/dL — ABNORMAL LOW (ref 8.9–10.3)
Chloride: 101 mmol/L (ref 98–111)
Creatinine, Ser: 0.74 mg/dL (ref 0.44–1.00)
GFR, Estimated: >60 mL/min (ref >60)
Glucose, Bld: 125 mg/dL — ABNORMAL HIGH (ref 70–99)
Potassium: 3.8 mmol/L (ref 3.5–5.1)
Sodium: 138 mmol/L (ref 135–145)
Total Bilirubin: 0.7 mg/dL (ref 0.3–1.2)
Total Protein: 5.2 g/dL — ABNORMAL LOW (ref 6.5–8.1)

## 2021-09-26 MED ORDER — GABAPENTIN 400 MG PO CAPS
800.0000 mg | ORAL_CAPSULE | Freq: Three times a day (TID) | ORAL | Status: DC
  Administered 2021-09-26 – 2021-10-03 (×21): 800 mg via ORAL
  Filled 2021-09-26 (×21): qty 2

## 2021-09-26 NOTE — Progress Notes (Signed)
Patient ID: Kaylee Ward, female   DOB: 1990-06-28, 31 y.o.   MRN: [id number]  Patient updated discharge address:  [address], [place]    Patient mother able to be present on Thursday for family education. Sw will discuss timeframe with patient and mother tomorrow.

## 2021-09-26 NOTE — Care Management (Signed)
Inpatient Rehabilitation Center Individual Statement of Services  Patient Name:  Kaylee Ward  Date:  09/26/2021  Welcome to the Inpatient Rehabilitation Center.  Our goal is to provide you with an individualized program based on your diagnosis and situation, designed to meet your specific needs.  With this comprehensive rehabilitation program, you will be expected to participate in at least 3 hours of rehabilitation therapies Monday-Friday, with modified therapy programming on the weekends.  Your rehabilitation program will include the following services:  Physical Therapy (PT), Occupational Therapy (OT), 24 hour per day rehabilitation nursing, Therapeutic Recreaction (TR), Psychology, Neuropsychology, Care Coordinator, Rehabilitation Medicine, Nutrition Services, Pharmacy Services, and Other  Weekly team conferences will be held on Tuesdays to discuss your progress.  Your Inpatient Rehabilitation Care Coordinator will talk with you frequently to get your input and to update you on team discussions.  Team conferences with you and your family in attendance may also be held.  Expected length of stay: 3-5 days   Overall anticipated outcome: Independent with an Assistive Device  Depending on your progress and recovery, your program may change. Your Inpatient Rehabilitation Care Coordinator will coordinate services and will keep you informed of any changes. Your Inpatient Rehabilitation Care Coordinator's name and contact numbers are listed  below.  The following services may also be recommended but are not provided by the Inpatient Rehabilitation Center:  Driving Evaluations Home Health Rehabiltiation Services Outpatient Rehabilitation Services Vocational Rehabilitation   Arrangements will be made to provide these services after discharge if needed.  Arrangements include referral to agencies that provide these services.  Your insurance has been verified to be:  Uninsured  Your primary  doctor is:  No PCP  Pertinent information will be shared with your doctor and your insurance company.  Inpatient Rehabilitation Care Coordinator:  Susie Cassette [phone] or ([phone]  Information discussed with and copy given to patient by: Gretchen Short, 09/26/2021, 9:45 AM

## 2021-09-26 NOTE — Progress Notes (Signed)
Occupational Therapy Session Note  Patient Details  Name: Kaylee Ward MRN: [id number] Date of Birth: 1990/12/30  Today's Date: 09/26/2021 OT Individual Time: 0900-1000 1st visit; 1400-1500 2nd visit  OT Individual Time Calculation (min): 60 min , 60 min    Short Term Goals: Week 1:  OT Short Term Goal 1 (Week 1): STG = LTG due to ELOS  Skilled Therapeutic Interventions/Progress Updates:   1st visit: Pt in recliner upon OT arrival for am self care retraining visit. Pt reports only pain in distal R LE 5/10 with meds given. Pt indep to manage wound vac to hook onto RW and OT provided S to amb off and on toilet ~20 ft x 2, toileting including peri hygiene with grab bar with S, sink side seated sponge bathing due to no showers permitted due to wound vac with close S after set up for sponge bathing and dressing LB, set up UB bathing and dressing, indep grooming. Pt was indep to manage back brace and state precautions for NWB and spine with + indep integration. Once back to recliner, pt was indep to manage wound vac off RW and OT left in recliner with LE's elevated, nurse call bell and needs within reach.   2nd visit: Pt asleep in recliner upon OT arrival. Pt requested to work on activity tolerance via w/c skills and mobility to hospital lobby with focus on pacing and managing obstacles and community skills with new amputation and precautions. Pt was able to access elevator with CGA to ensure safety, self propel ~ 200 ft x 2 with prolonged rest break between.  Wound vac secured in w/c bag. OT educated on energy conservation principles with pt able to integrate with min cues. Pt was able to access kiosk for snack retrieval with min A. Once back to room, pt amb from doorway to recliner ~20 ft with close S managing wound vac. OT ensured all safety measures in place at conclusion of session.   Therapy/Group: Individual Therapy  Barnabas Lister 09/26/2021, 4:30 PM

## 2021-09-26 NOTE — Progress Notes (Signed)
Physical Therapy Session Note  Patient Details  Name: Kaylee Ward MRN: [id number] Date of Birth: 07/08/1990  Today's Date: 09/26/2021 PT Individual Time: 4536-4680 PT Individual Time Calculation (min): 75 min   Short Term Goals: Week 1:  PT Short Term Goal 1 (Week 1): = LTGs due to ELOS  Skilled Therapeutic Interventions/Progress Updates:    Pt received in recliner and agreeable to therapy.  Pt reports 6/10, RN provided pain medication and pain down to 5/10 by end of session. Sit to stand and short distance gait with RW and supervision. Pt able to manage wound vac independently.  Stairs 4 x 4 with supervision forward nav. Pt propelled w/c with BUE x 200 with GTB resistance, then 3 x 150 ft pulling therapist on rolling stool for increased resistance for endurance and UE strength. Pt reports fatigue with last 50 ft of activity. Transitioned to single squats 4 x 8 and standing calf raise 4 x 20, with BUE support on RW. Pt with mild discomfort with squats that dissipated with improved anterior trunk lean. Pt propelled chair back to room and performed ambulatory transfer to recliner with supervision. Pt remained with all needs in reach.  Therapy Documentation Precautions:  Precautions Precautions: Fall, Posterior Hip, Back Precaution Booklet Issued: No Precaution Comments: pt able to state all precautions correctly Required Braces or Orthoses: Spinal Brace Spinal Brace: Lumbar corset, Applied in sitting position Restrictions Weight Bearing Restrictions: Yes RLE Weight Bearing: Touchdown weight bearing General:       Therapy/Group: Individual Therapy  [name] 09/26/2021, 11:18 AM
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
## 2021-09-26 NOTE — Progress Notes (Signed)
Inpatient Rehabilitation  Patient information reviewed and entered into eRehab system by Amiir Heckard M. Folashade Gamboa, M.A., CCC/SLP, PPS Coordinator.  Information including medical coding, functional ability and quality indicators will be reviewed and updated through discharge.    
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
## 2021-09-27 DIAGNOSIS — T07XXXA Unspecified multiple injuries, initial encounter: Secondary | ICD-10-CM | POA: Diagnosis not present

## 2021-09-27 LAB — URINE CULTURE

## 2021-09-27 LAB — THC,MS,WB/SP RFX
Cannabidiol: NEGATIVE ng/mL
Cannabinoid Confirmation: POSITIVE
Carboxy-THC: 59.5 ng/mL
Hydroxy-THC: NEGATIVE ng/mL
Tetrahydrocannabinol(THC): 3.7 ng/mL

## 2021-09-27 MED ORDER — BENZOCAINE 10 % MT GEL
OROMUCOSAL | Status: DC | PRN
Start: 2021-09-27 — End: 2021-10-03
  Filled 2021-09-27: qty 9

## 2021-09-27 MED ORDER — DEXAMETHASONE 4 MG PO TABS
6.0000 mg | ORAL_TABLET | Freq: Three times a day (TID) | ORAL | Status: DC
  Administered 2021-09-27 – 2021-09-30 (×10): 6 mg via ORAL
  Filled 2021-09-27 (×10): qty 1
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
## 2021-09-27 NOTE — Progress Notes (Signed)
Pt requested pain med before therapy. Writer went to pt with 10mg  of oxycodone along with scheduled gabapentin and decadron. Pt prefers to take pills one at a time. Oxycodone was given first, however noted one oxycodone pill on pt's covers. Informed pt that there is a pill left on the covers and asked pt to take the pill. Pt picked up the pill and pretended to take it along with a gabapentin capsule, however pt left the pill still in her hand and dropped it on the cover and pulled the side of her blanket to cover it using one hand while picking up her drink with the other hand. Writer pointed out again that there is still a pill on her cover and asked her to take it. Pt finally took the pill and stated "I don't know why that pill keeps dropping." Alerted Dr. and Berline Chough, Jesusita Oka.   [place], RN
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
## 2021-09-27 NOTE — Progress Notes (Signed)
Patient ID: Kaylee Ward, female   DOB: 06-24-1990, 31 y.o.   MRN: [id number]  SW met with pt in room to provide updates from team conference, and d/c date 6/19. Pt is aware will not require wound vac at home, and nursing can teach pt mother how to perform wound dressings changes. Fam edu scheduled for Thursday (6/15) 1pm-4pm.  Loralee Pacas, MSW, [place] Office: [phone] Cell: [phone] Fax: [phone]

## 2021-09-27 NOTE — Progress Notes (Signed)
Physical Therapy Session Note  Patient Details  Name: Kaylee Ward MRN: [id number] Date of Birth: 02/05/1991  Today's Date: 09/27/2021 PT Individual Time: 5400-8676 PT Individual Time Calculation (min): 70 min   Short Term Goals: Week 1:  PT Short Term Goal 1 (Week 1): = LTGs due to ELOS  Skilled Therapeutic Interventions/Progress Updates:    Pt received in recliner and agreeable to therapy.  Pt reports pain well-controlled on medication. Pt able to manage wound vac, donning/doffing back brace, and RW with supervision. ambulatory transfer to bathroom. Distant supervision for 3/3 toileting tasks. Pt then propelled w/c with supervision to [facility] for holistic benefit of outdoor environment mood and outlook. Pt propelled w/c with BUE >500 ft, including up and down long slope for increased endurance and strength. Navigated tight spaces, weaving through obstacles, and over unlevel surfaces without rest break. Pt then performed single leg squats and calf raises for strength and endurance. Pt propelled back to room and performed supervision ambulatory transfer back to recliner and was left with all needs in reach.   Therapy Documentation Precautions:  Precautions Precautions: Fall, Posterior Hip, Back Precaution Booklet Issued: No Precaution Comments: pt able to state all precautions correctly Required Braces or Orthoses: Spinal Brace Spinal Brace: Lumbar corset, Applied in sitting position Restrictions Weight Bearing Restrictions: Yes RLE Weight Bearing: Touchdown weight bearing General:       Therapy/Group: Individual Therapy  [name] 09/27/2021, 12:17 PM

## 2021-09-27 NOTE — Progress Notes (Signed)
Occupational Therapy Session Note  Patient Details  Name: Kaylee Ward MRN: [id number] Date of Birth: [birth year]  Today's Date: 09/27/2021 OT Individual Time: 0800-0900 1st visit; 1415-1530 2nd visit  OT Individual Time Calculation (min): 60 min 75 min    Short Term Goals: Week 1:  OT Short Term Goal 1 (Week 1): STG = LTG due to ELOS  Skilled Therapeutic Interventions/Progress Updates:    1st visit: Pt in recliner completing visit with MD upon OT arrival for am self care retraining visit. Pt reports only pain in distal R LE 5/10 with meds given prior. Pt indep to manage wound vac to hook onto RW and OT provided S to amb off and on toilet ~20 ft x 2, toileting including peri hygiene with grab bar with S, amb to shower chair in stall shower with back to hand held shower spray and OT assisted with hair washing tray placement and seated and standing level hair care with CGA, sink side seated sponge bathing due to no showers permitted due to wound vac with close S after set up for sponge bathing and dressing LB, set up UB bathing and dressing, indep grooming. Pt was indep to manage back brace and state precautions for NWB and spine with + indep integration. Once back to recliner, pt was indep to manage wound vac off RW. OT discussed discharge needs prior to team conference with no OT follow up anticipated except potential for w/c and RW. OT left in recliner with LE's elevated, nurse call bell and needs within reach.    2nd visit: Focus of 2nd OT session on functional standing and activity tolerance, strengthening, transfer safety and community skills re-training. Pt in recliner awaiting OT session. Pt able to self manage wound vac and RW to amb with close S to bathroom for toileting then to w/c ~ 15 ft. Pt able to manage peri hygiene and menstrual pad change with close S. Stood sink side for hand washing with reaching OBOS 4" laterally with CGA. W/c skills with community access progression with  reaching into cooler and cabinet with CGA. Transfer off and on regular park bench without UE support to L side with CGA and min cues. Total w/c self propulsion 200 ft x 2 with rest between intervals. Once back to room, pt able to transport purchased items on RW from w/c with CGA fading to close S and adjust wound vac off RW and recliner for LE elevation with S. Left in room in recliner at end of session with all needs within reach including nurse call bell.     Therapy/Group: Individual Therapy  Vicenta Dunning 09/27/2021, 12:50 PM
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
## 2021-09-28 DIAGNOSIS — D72829 Elevated white blood cell count, unspecified: Secondary | ICD-10-CM | POA: Diagnosis not present

## 2021-09-28 DIAGNOSIS — T07XXXA Unspecified multiple injuries, initial encounter: Secondary | ICD-10-CM | POA: Diagnosis not present

## 2021-09-28 DIAGNOSIS — D62 Acute posthemorrhagic anemia: Secondary | ICD-10-CM

## 2021-09-28 LAB — CBC WITH DIFFERENTIAL/PLATELET
Abs Immature Granulocytes: 0.19 10*3/uL — ABNORMAL HIGH (ref 0.00–0.07)
Basophils Absolute: 0 10*3/uL (ref 0.0–0.1)
Basophils Relative: 0 %
Eosinophils Absolute: 0 10*3/uL (ref 0.0–0.5)
Eosinophils Relative: 0 %
HCT: 23.5 % — ABNORMAL LOW (ref 36.0–46.0)
Hemoglobin: 8.1 g/dL — ABNORMAL LOW (ref 12.0–15.0)
Immature Granulocytes: 1 %
Lymphocytes Relative: 12 %
Lymphs Abs: 2 10*3/uL (ref 0.7–4.0)
MCH: 32.4 pg (ref 26.0–34.0)
MCHC: 34.5 g/dL (ref 30.0–36.0)
MCV: 94 fL (ref 80.0–100.0)
Monocytes Absolute: 1 10*3/uL (ref 0.1–1.0)
Monocytes Relative: 6 %
Neutro Abs: 14.4 10*3/uL — ABNORMAL HIGH (ref 1.7–7.7)
Neutrophils Relative %: 81 %
Platelets: 357 10*3/uL (ref 150–400)
RBC: 2.5 MIL/uL — ABNORMAL LOW (ref 3.87–5.11)
RDW: 13.5 % (ref 11.5–15.5)
WBC: 17.6 10*3/uL — ABNORMAL HIGH (ref 4.0–10.5)
nRBC: 0 % (ref 0.0–0.2)

## 2021-09-28 LAB — BASIC METABOLIC PANEL
Anion gap: 7 (ref 5–15)
BUN: 14 mg/dL (ref 6–20)
CO2: 28 mmol/L (ref 22–32)
Calcium: 8.3 mg/dL — ABNORMAL LOW (ref 8.9–10.3)
Chloride: 101 mmol/L (ref 98–111)
Creatinine, Ser: 0.7 mg/dL (ref 0.44–1.00)
GFR, Estimated: >60 mL/min (ref >60)
Glucose, Bld: 113 mg/dL — ABNORMAL HIGH (ref 70–99)
Potassium: 4 mmol/L (ref 3.5–5.1)
Sodium: 136 mmol/L (ref 135–145)

## 2021-09-28 LAB — DRUG SCREEN 10 W/CONF, SERUM
Amphetamines, IA: NEGATIVE ng/mL
Barbiturates, IA: NEGATIVE ug/mL
Benzodiazepines, IA: POSITIVE ng/mL — AB
Cocaine & Metabolite, IA: NEGATIVE ng/mL
Methadone, IA: NEGATIVE ng/mL
Opiates, IA: POSITIVE ng/mL — AB
Oxycodones, IA: POSITIVE ng/mL — AB
Phencyclidine, IA: NEGATIVE ng/mL
Propoxyphene, IA: NEGATIVE ng/mL
THC(Marijuana) Metabolite, IA: POSITIVE ng/mL — AB

## 2021-09-28 LAB — OXYCODONES,MS,WB/SP RFX
Oxycocone: 4.1 ng/mL
Oxycodones Confirmation: POSITIVE
Oxymorphone: NEGATIVE ng/mL

## 2021-09-28 LAB — BENZODIAZEPINES,MS,WB/SP RFX
7-Aminoclonazepam: NEGATIVE ng/mL
Alprazolam: NEGATIVE ng/mL
Benzodiazepines Confirm: POSITIVE
Chlordiazepoxide: NEGATIVE
Clonazepam: NEGATIVE ng/mL
Desalkylflurazepam: NEGATIVE ng/mL
Desmethylchlordiazepoxide: NEGATIVE
Desmethyldiazepam: NEGATIVE ng/mL
Diazepam: NEGATIVE ng/mL
Flurazepam: NEGATIVE ng/mL
Lorazepam: NEGATIVE ng/mL
Midazolam: 6.3 ng/mL
Oxazepam: NEGATIVE ng/mL
Temazepam: NEGATIVE ng/mL
Triazolam: NEGATIVE ng/mL

## 2021-09-28 LAB — OPIATES,MS,WB/SP RFX
6-Acetylmorphine: NEGATIVE
Codeine: NEGATIVE ng/mL
Dihydrocodeine: NEGATIVE ng/mL
Hydrocodone: NEGATIVE ng/mL
Hydromorphone: 8.7 ng/mL
Morphine: NEGATIVE ng/mL
Opiate Confirmation: POSITIVE

## 2021-09-28 NOTE — Progress Notes (Signed)
Occupational Therapy Session Note  Patient Details  Name: Kaylee Ward MRN: [id number] Date of Birth: 01/04/1991  Today's Date: 09/28/2021 OT Individual Time: 0800-0915 1st visit; 1330-1430 2nd visit  OT Individual Time Calculation (min): 75 min 1st visit; 60 min 2nd visit     Short Term Goals: Week 1:  OT Short Term Goal 1 (Week 1): STG = LTG due to ELOS  Skilled Therapeutic Interventions/Progress Updates:  1st visit: Pt seated in recliner completing am meds with RN upon OT arrival. Awaiting wound vac d/c this day as per care coord with team.  Pt reports only pain in distal R LE 4/10 this am. Pt indep to manage wound vac to hook onto RW and OT provided S to amb off and on toilet ~20 ft x 2, toileting including peri hygiene with grab bar with dist S. Indep grooming seated level sink side. Pt self propelled to elevators and negotiated elevator with close s. Access to ground floor cafeteria with standing for item retrieval on counter for coffee prep with close S. No rests required but overall close s for safety.  Following therapeutic activity, pt amb via RW back to recliner, pt was indep to manage wound vac off RW. OT discussed potential for shower once wound vac d/c'd. Skin team arrived and OT assisted for skin inspection and then OT left in recliner with LE's elevated, nurse call bell and needs within reach.      2nd visit: Focus of 2nd OT session on functional standing and activity tolerance, strengthening, transfer safety for tub shower and grab bar recommendation as well as potential need for shower curtain liner accomodation. Wound vac had just been d/c'd, pt's lower R leg wrapped in c/d/I ACE wrap, however pt given pain meds and OT care coord with RN and decided to hold 1st shower until tomorrow when mother will be in for Family Educ. Pt reports her cousin has shower DME for home use at her mother's apartment for tub shower access. Pt agreeable to all activities and performed the same amb  levels to toilet and hand wash prior to transfer to w/c to access therapy areas. Pt self propelled to demo apartment ~ 100 ft x 2 with S,. OT instructed in tub shower transfer training with pt requiring CGA to access. Once back to room, pt able to amb via RW to recliner and add pillow props under R LE for LE elevation with S. OT training in green (med-heavy) theraband for UE HEP for tricep press x 3 sets of 10 reps Bly. Left in room in recliner at end of session with all needs within reach including nurse call bell.   Therapy/Group: Individual Therapy  Vicenta Dunning 09/28/2021, 3:59 PM

## 2021-09-28 NOTE — Progress Notes (Signed)
Wound vac discontinued per order; Patient unable to tolerate removing the foam dressing attached to the wound; Soaked with saline but patient screams when being pulled. Patient requested not to remove now but tomorrow. Dilaudid given per request. Wrapped leg with gauze, kerlix and ace per order.
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
## 2021-09-28 NOTE — Progress Notes (Signed)
Physical Therapy Session Note  Patient Details  Name: Kaylee Ward MRN: [id number] Date of Birth: [birth year]  Today's Date: 09/28/2021 PT Individual Time: [id number] PT Individual Time Calculation (min): 60 min   Short Term Goals: Week 1:  PT Short Term Goal 1 (Week 1): = LTGs due to ELOS  Skilled Therapeutic Interventions/Progress Updates:    Pt received in recliner and agreeable to therapy.  Pt reports 4/10 pain, premedicated. Rest and positioning provided as needed. Pt requested to use the bathroom, ambulatory transfer with supervision and RW. Continent B+B void, documented in flow sheet. Supervision for 3/3 toileting tasks. Pt propelled w/c with BUE to day room. Squat pivot to mat table with supervision. Pt performed seated marches with ankle weight on R thigh for strength of RLE, maintained hip precautions with slight recline. During rest breaks, pt requested to see imaging and picture from ED. Discussed pt's images and chain of events in ED as pt remembers vs notes.  Pt performed hip extension in prone 3 x 12 BIL for improved hip strength and stability while NWB. Pt returned to room and to recliner, was left with all needs in reach.   Therapy Documentation Precautions:  Precautions Precautions: Fall, Posterior Hip, Back Precaution Booklet Issued: No Precaution Comments: pt able to state all precautions correctly Required Braces or Orthoses: Spinal Brace Spinal Brace: Lumbar corset, Applied in sitting position Restrictions Weight Bearing Restrictions: Yes RLE Weight Bearing: Touchdown weight bearing General:       Therapy/Group: Individual Therapy  Mickel Fuchs 09/28/2021, 10:29 AM

## 2021-09-29 DIAGNOSIS — S98131A Complete traumatic amputation of one right lesser toe, initial encounter: Secondary | ICD-10-CM | POA: Diagnosis not present

## 2021-09-29 DIAGNOSIS — T07XXXA Unspecified multiple injuries, initial encounter: Secondary | ICD-10-CM | POA: Diagnosis not present

## 2021-09-29 DIAGNOSIS — S32009D Unspecified fracture of unspecified lumbar vertebra, subsequent encounter for fracture with routine healing: Secondary | ICD-10-CM

## 2021-09-29 DIAGNOSIS — D72829 Elevated white blood cell count, unspecified: Secondary | ICD-10-CM | POA: Diagnosis not present

## 2021-09-29 MED ORDER — SILVER SULFADIAZINE 1 % EX CREA
TOPICAL_CREAM | Freq: Every day | CUTANEOUS | Status: DC
Start: 2021-09-29 — End: 2021-10-03
  Filled 2021-09-29: qty 85

## 2021-09-29 NOTE — Progress Notes (Signed)
Occupational Therapy Session Note  Patient Details  Name: Kaylee Ward MRN: [id number] Date of Birth: 03-23-1991  Today's Date: 09/29/2021 OT Individual Time: 7169-6789 1st visit; 1310-1410 visit 2  OT Individual Time Calculation (min): 55 min 1st visit; 60 min visit 2   Short Term Goals: Week 1:  OT Short Term Goal 1 (Week 1): STG = LTG due to ELOS  Skilled Therapeutic Interventions/Progress Updates:  Visit 1: Pt open to all activities presented this am and eager for her mom to come later for [facility] Education. Pt to be seen by wound RN later am as RN on phone with wound RN currently while administering am pain meds. Pt reported 5/10 distal R LE. OT will plan for shower training (once MD clears with visit later AM) and full self care routine with mother and conduct general balance and functional strength training this session. Pt amb from recliner to and from w/c placed 25 ft with Dist S then pt self propelled to elevators to access cafeteria on ground floor. OT training for item retrieval in standing with no cues needed for NWB R LE but close S to reach >4-5" obos x 6 excursions. OT educated on safest strategy for item retrieval and transport along counter top using bridging techniques as well as use of w/c bag to transport items back to unit. Pt performed tband ex for sh flexion/ext, horiz sh abd/add, elbow ext and is now indep with this UE HEP. Left pt in recliner with R LE elevated and all safety measures in place.   Visit 2: OT arrived for planned Family Education session and mother was wheeling pt in w/c to meet father on 1st floor of hospital for family training. OT assisted pt into elevator and waited for family in lobby after gathering all supplies for maintaining dry R LE for shower training. Once back on unit, OT conducted 1st part of family education in the tub room as mother has tub shower combo at home. OT training for transfer to transfer bench with instructions on safe technique  and strategies for H2O management and maintaining moisture free R LE. Mother was provided information on where to obtain shower liner, non slip mat and suction grab bars as needed. Pt self propelled back to room and OT conducted full shower safety routine training with functional amb, balance, activity tolerance precautions and skin protection education. Pt required Close S for shower portion bathing with R LE covered and propped outside curtain on stool with heel float to maintain NWB prec. Pt then able to complete dressing, hair care and grooming with dist S. Mom and dad both reported no further questions or need for additional information at this time. OT left pt in the care of RN who came to room for her portion of Family Educ. OT to continue to progress toward mod I until tentative Grad Day 6/17 or 6/17 with d/c Monday 6/19.   Therapy Documentation Precautions:  Precautions Precautions: Fall, Posterior Hip, Back Precaution Booklet Issued: No Precaution Comments: pt able to state all precautions correctly Required Braces or Orthoses: Spinal Brace Spinal Brace: Lumbar corset, Applied in sitting position Restrictions Weight Bearing Restrictions: Yes RLE Weight Bearing: Touchdown weight bearing  Pain:5/10 R distal LE with meds given prior to therapy   Therapy/Group: Individual Therapy  Kaylee Ward 09/29/2021, 8:56 AM
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
## 2021-09-29 NOTE — Plan of Care (Signed)
Slept well last night. Breakthrough pain medication as needed. Looks well hydrated. Wound dressing to be done during day shift. Safety maintained.

## 2021-09-29 NOTE — Progress Notes (Signed)
Physical Therapy Note  Patient Details  Name: ROSIA SYME MRN: [id number] Date of Birth: [birth year] Today's Date: 09/29/2021  Missed time 30 min.  Pt sitting in recliner w/ wound unwrapped and to have nursing re-dress.  Pt unable to participate at this time.  Will attempt at later time if able.   Lucio Edward 09/29/2021, 10:29 AM
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
## 2021-09-29 NOTE — Progress Notes (Signed)
Family education done on dressing change with pt's parents. Encouraged family to do hands on practice with staff prior to discharge. No other questions at this time.   Marylu Lund, RN

## 2021-09-29 NOTE — Progress Notes (Addendum)
Patient ID: Kaylee Ward, female   DOB: 1991/01/17, 31 y.o.   MRN: [id number]  SW ordered DME: RW and w/c with [employer] via parachute.   *SW met with pt in room to discuss DME. Pt would like to know if [facility] will be ordered. Unsure on if therapy asked for 3in1BSC or DABSC. SW waiting to hear from therapy on recommended item. Pt mother present for family edu today.   3in1 BS ordered with [employer] via parachute.   Loralee Pacas, MSW, [place] Office: [phone] Cell: [phone] Fax: [phone]

## 2021-09-29 NOTE — Progress Notes (Signed)
Physical Therapy Session Note  Patient Details  Name: Kaylee Ward MRN: [id number] Date of Birth: [birth year]  Today's Date: 09/29/2021 PT Individual Time: 1308-6578 PT Individual Time Calculation (min): 54 min   Short Term Goals: Week 1:  PT Short Term Goal 1 (Week 1): = LTGs due to ELOS Week 2:    Week 3:     Skilled Therapeutic Interventions/Progress Updates:    Pt seen this pm for family education.  Initially w/nursing for education/in process of dressing change.  Session delayed x 8 min for completion of training w/nursing.  Family education completed included:  Stair training - pt able to perform ascending/descending x20steps (4x5) w/2 rails w/supervision only, maintains nwbing easily.  Curb: therapist had previously added 10lb wt to walker to counter wt of woundvac.  Removed wts from walker.  pt instructed w/proper technique for negotiating curb w/RW.  Family observed.  pt able to ascend via both forward and reverse approach, descend forward approach w/rw w/supervision, maintains nwb easily.  Car transfer - reviewed hip precautions w/pt/family and technique for maintaining during transfer.  Pt then performs ambulatory approach + car transfer w/RW w/supervision.  Maintains wbing and hip precautions.  Pt demonstrates and verbalizes good understanding of precautions.  Wc parts management, folding, removal of arm/legrests if difficulty w/wt of chair/loading/unloading into vehicle.  Family demonstrated good understanding w/all.  Gait:  >236ft w/RW, nwbing RLE,  supervision only, transfers to recliner/supervision.  Pt instructed w/gentle ankle AROM w/LE elevated and educated on importance of avoiding contracture formation via AROM using pain as guide.  Pt performed to tolerance.   Pt/mother requested miraplex dressing for flank wounds, unable to locate size of dressing, nursing consulted for assist.  Pt left oob in recliner w/family at side needs in reach.  Handed off to nursing.       Therapy Documentation Precautions:  Precautions Precautions: Fall, Posterior Hip, Back Precaution Booklet Issued: No Precaution Comments: pt able to state all precautions correctly Required Braces or Orthoses: Spinal Brace Spinal Brace: Lumbar corset, Applied in sitting position Restrictions Weight Bearing Restrictions: Yes RLE Weight Bearing: Non weight bearing       Therapy/Group: Individual Therapy Rada Hay, PT   Kaylee Ward 09/29/2021, 3:48 PM

## 2021-09-30 MED ORDER — DEXAMETHASONE 4 MG PO TABS
4.0000 mg | ORAL_TABLET | Freq: Three times a day (TID) | ORAL | Status: DC
  Administered 2021-09-30 – 2021-10-02 (×6): 4 mg via ORAL
  Filled 2021-09-30 (×6): qty 1

## 2021-09-30 NOTE — Progress Notes (Signed)
Physical Therapy Session Note  Patient Details  Name: Kaylee Ward MRN: [id number] Date of Birth: May 22, 1990  Today's Date: 09/30/2021 PT Individual Time: 1300-1416 PT Individual Time Calculation (min): 76 min   Short Term Goals: Week 1:  PT Short Term Goal 1 (Week 1): = LTGs due to ELOS  Skilled Therapeutic Interventions/Progress Updates:    Pt received in recliner and agreeable to therapy.  Pt donned LSO independently. Reports pain well controlled on medication at this time. Requested to use bathroom. ambulatory transfer to toilet, distant supervision for 3/3 toileting tasks. Continent bladder void. Hand hygiene in standing with supervision. Set up pt's personal w/c and adjusted leg rests for appropriate length. Pt propelled w/c with [facility] to [facility] for holistic benefit of outdoor environment and community integration. Discussed home set up and d/c plan. MMT and sensory testing performed in preparation for d/c. Pt able to articulate all precautions without cueing. Discussed continued movement at R ankle to prevent contracture formation. Pt then propelled w/c up/down long slope for endurance and practice with uneven surfaces. Pt then returned to room, including navigating elevators and returned to recliner with ambulatory transfer with RW with supervision. Pt remained in w/c with all needs in reach.   Therapy Documentation Precautions:  Precautions Precautions: Fall, Posterior Hip, Back Precaution Booklet Issued: No Precaution Comments: pt able to state all precautions correctly Required Braces or Orthoses: Spinal Brace Spinal Brace: Lumbar corset, Applied in sitting position Restrictions Weight Bearing Restrictions: Yes RLE Weight Bearing: Non weight bearing General:      Therapy/Group: Individual Therapy  [name] 09/30/2021, 3:50 PM

## 2021-09-30 NOTE — Progress Notes (Signed)
Occupational Therapy Session Note  Patient Details  Name: Kaylee Ward MRN: [id number] Date of Birth: Jan 12, 1991  Today's Date: 09/30/2021 OT Individual Time: 1530-1630 OT Individual Time Calculation (min): 60 min    Short Term Goals: Week 1:  OT Short Term Goal 1 (Week 1): STG = LTG due to ELOS  Skilled Therapeutic Interventions/Progress Updates:     Upon OT arrival, pt seated in recliner and reports 8/10 pain. Agreeable to OT session. Treatment session focused on functional transfers, standing balance/tolerance, IADL tasks, UE strengthening, and community mobility. Pt completes sit to stand transfer with Mod I and ambulates to bathroom with Mod I to complete toilet transfer and toileting at the levels below. Pt ambulates to sink Mod I with RW and completes hand hygiene at level stated below. Pt then transfers into w/c Mod I and propels herself to rehab apartment Mod I. Pt completes sit to stand transfer with RW and Mod I to retrieve items from pantry, fridge, and silverware drawer and transport using walker bag with Mod I to simulate simple meal prep. Pt demonstrates no difficulty to complete and returns all items into their designated spot before task was ended. Pt propels self via w/c to elevator, manages elevator, and propels self to cafeteria Mod I. Pt completes sit to stand transfer with RW and Mod I and ambulates within cafeteria without difficulty to retrieve [employer] drink from cooler, place into walker bag, and transport to cashier. Pt manages money at countertop to pay for her drink and ambulates back to her w/c Mod I and completes stand to sit transfer with Mod I. Pt propels and navigates through hospital back up to her room Mod I and completes sit to stand transfer Mod I to engage in oral care standing sink side at the levels below. Pt ambulates back to her recliner Mod I and was left in recliner with all needs met. Therapist educated pt on how to properly set up [facility] before she returns  home and pt verbalizes understanding. Pt progressing towards stated OT goals and continues to benefit from OT services to achieve highest level of independence.  Therapy Documentation Precautions:  Precautions Precautions: Fall, Posterior Hip, Back Precaution Booklet Issued: No Precaution Comments: pt able to state all precautions correctly Required Braces or Orthoses: Spinal Brace Spinal Brace: Lumbar corset, Applied in sitting position Restrictions Weight Bearing Restrictions: Yes RLE Weight Bearing: Non weight bearing  ADL: Grooming: Modified independent Where Assessed-Grooming: Standing at sink Toileting: Modified independent Where Assessed-Toileting: Glass blower/designer: Modified Programmer, applications Method: Counselling psychologist: Grab bars, Bedside commode   Therapy/Group: Individual Therapy  Marvetta Gibbons 09/30/2021, 5:14 PM

## 2021-09-30 NOTE — Progress Notes (Shared)
Occupational Therapy Discharge Summary  Patient Details  Name: Kaylee Ward MRN: [id number] Date of Birth: 1991-03-18  {CHL IP REHAB OT TIME CALCULATIONS:[id number]}   Patient has met 7 of 7 long term goals due to improved activity tolerance, improved balance, and ability to compensate for deficits.  Patient to discharge at overall Supervision level for shower safety due to wound and NWB R LE otherwise  mod I. Patient's care partner is independent to provide the necessary physical assistance at discharge.  Pt's mom and dad have been in for [facility] Education and all OT training in preparation for d/c has been completed with family.   Reasons goals not met: n/a   Recommendation:  Patient no longer requires skilled OT after CIR d/c.   Equipment: RW bag, 3 in 1 commode, RW, w/c and pt has shower bench and will purchase shower curtain liner and suction grab bar if needed (family provided with info)    Reasons for discharge: treatment goals met  Patient/family agrees with progress made and goals achieved: Yes  OT Discharge Precautions/Restrictions  Precautions Precautions: Fall;Posterior Hip;Back Precaution Booklet Issued: No Precaution Comments: pt able to state all precautions correctly Required Braces or Orthoses: Spinal Brace Spinal Brace: Lumbar corset;Applied in sitting position Restrictions Weight Bearing Restrictions: Yes RLE Weight Bearing: Non weight bearing General   Vital Signs Therapy Vitals Temp: 98.3 F (36.8 C) Pulse Rate: 100 Resp: 18 BP: 122/74 Patient Position (if appropriate): Sitting Oxygen Therapy SpO2: 99 % O2 Device: Room Air Pain Pain Assessment Pain Scale: 0-10 Pain Score: 5  Pain Type: Acute pain Pain Location: Foot Pain Orientation: Right Pain Descriptors / Indicators: Aching Pain Frequency: Constant Pain Onset: On-going Patients Stated Pain Goal: 0 Pain Intervention(s): Pain med given for lower pain score than stated, per patient  request;Repositioned;Elevated extremity;Rest ADL ADL Equipment Provided: Other (comment) (RW bag) Eating: Independent Grooming: Modified independent Where Assessed-Grooming: Standing at sink Upper Body Bathing: Modified independent Where Assessed-Upper Body Bathing: Sitting at sink, Standing at sink Lower Body Bathing: Supervision/safety Where Assessed-Lower Body Bathing: Shower Upper Body Dressing: Independent Where Assessed-Upper Body Dressing: Chair, Standing at sink, Sitting at sink Lower Body Dressing: Modified independent Where Assessed-Lower Body Dressing: Standing at sink, Sitting at sink Toileting: Modified independent Where Assessed-Toileting: Glass blower/designer: Diplomatic Services operational officer Method: Counselling psychologist: Grab bars, Engineer, technical sales Transfer: Close supervison Clinical cytogeneticist Method: Librarian, academic: Radio broadcast assistant, Shower seat without back Social research officer, government: Distant supervision Social research officer, government Method: Radiographer, therapeutic: Civil engineer, contracting with back, Shower seat without back, Transfer tub bench ADL Comments: Pt has progressed to mod I for most self care except managing shower due to needing to maintain NWB R LEand wound covered. Vision Baseline Vision/History: 0 No visual deficits Patient Visual Report: No change from baseline Vision Assessment?: No apparent visual deficits Perception  Perception: Within Functional Limits Praxis Praxis: Intact Cognition Cognition Overall Cognitive Status: Within Functional Limits for tasks assessed Arousal/Alertness: Awake/alert Orientation Level: Person;Place;Situation Person: Oriented Place: Oriented Situation: Oriented Memory: Appears intact Attention: Divided Focused Attention: Appears intact Divided Attention: Appears intact Awareness: Appears intact Problem Solving: Appears intact Safety/Judgment: Appears  intact Brief Interview for Mental Status (BIMS) Repetition of Three Words (First Attempt): 3 Temporal Orientation: Year: Correct Temporal Orientation: Month: Accurate within 5 days Temporal Orientation: Day: Correct Recall: "Sock": Yes, no cue required Recall: "Blue": Yes, no cue required Recall: "Bed": Yes, no cue required BIMS Summary Score: 15 Sensation Sensation Light Touch:  Appears Intact Hot/Cold: Appears Intact Proprioception: Appears Intact Stereognosis: Appears Intact Coordination Gross Motor Movements are Fluid and Coordinated: Yes Fine Motor Movements are Fluid and Coordinated: Yes Coordination and Movement Description: NWB R LE Motor  Motor Motor: Within Functional Limits Mobility  Bed Mobility Bed Mobility: Supine to Sit;Sit to Supine Supine to Sit: Independent Sit to Supine: Independent Transfers Sit to Stand: Independent with assistive device Stand to Sit: Independent with assistive device  Trunk/Postural Assessment  Cervical Assessment Cervical Assessment: Within Functional Limits Thoracic Assessment Thoracic Assessment: Within Functional Limits Lumbar Assessment Lumbar Assessment: Exceptions to [facility] Lumbar AROM Overall Lumbar AROM: Due to pain;Due to precautions Postural Control Postural Control: Within Functional Limits  Balance Balance Balance Assessed: Yes Static Sitting Balance Static Sitting - Balance Support: No upper extremity supported Static Sitting - Level of Assistance: 7: Independent Dynamic Sitting Balance Dynamic Sitting - Balance Support: No upper extremity supported Dynamic Sitting - Level of Assistance: 7: Independent Sitting balance - Comments: WNLs Static Standing Balance Static Standing - Balance Support: Bilateral upper extremity supported Static Standing - Level of Assistance: 6: Modified independent (Device/Increase time) Dynamic Standing Balance Dynamic Standing - Balance Support: During functional activity Dynamic  Standing - Level of Assistance: 6: Modified independent (Device/Increase time) Dynamic Standing - Balance Activities: Reaching for objects;Reaching across midline Extremity/Trunk Assessment RUE Assessment RUE Assessment: Within Functional Limits LUE Assessment LUE Assessment: Within Functional Limits   Barnabas Lister 09/30/2021, 4:50 PM
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
## 2021-09-30 NOTE — Progress Notes (Signed)
Physical Therapy Discharge Summary  Patient Details  Name: Kaylee Ward MRN: [id number] Date of Birth: 1990-07-22  {CHL IP REHAB PT TIME CALCULATION:304800500}   Patient has met {NUMBERS 0-12:18577} of 11 long term goals due to improved activity tolerance, improved balance, improved postural control, increased strength, increased range of motion, decreased pain, and ability to compensate for deficits.  Patient to discharge at a wheelchair level Modified Independent.   Patient's care partner is independent to provide the necessary physical assistance at discharge. Pt to d/c home with her mother who has undergone hands on family education. Pt is capable and competent to direct her care.   Reasons goals not met: ***  Recommendation:  Patient will benefit from ongoing skilled PT services in outpatient setting  after wound healing and lifting of WB precautions to continue to advance safe functional mobility, address ongoing impairments in strength, ROM, gait mechanics, and minimize fall risk.  Equipment: W/c with elevating leg rests, RW  Reasons for discharge: treatment goals met and discharge from hospital  Patient/family agrees with progress made and goals achieved: Yes  PT Discharge Precautions/Restrictions Precautions Precautions: Fall;Posterior Hip;Back Precaution Booklet Issued: No Precaution Comments: pt able to state all precautions correctly Required Braces or Orthoses: Spinal Brace Spinal Brace: Lumbar corset;Applied in sitting position Restrictions Weight Bearing Restrictions: Yes RLE Weight Bearing: Non weight bearing Vital Signs Therapy Vitals Temp: 98.3 F (36.8 C) Pulse Rate: 100 Resp: 18 BP: 122/74 Patient Position (if appropriate): Sitting Oxygen Therapy SpO2: 99 % O2 Device: Room Air Pain Pain Assessment Pain Scale: 0-10 Pain Score: 7  Pain Type: Acute pain Pain Location: Foot Pain Descriptors / Indicators: Aching;Throbbing Pain Frequency:  Constant Pain Onset: On-going Pain Intervention(s): Medication (See eMAR) Pain Interference Pain Interference Pain Effect on Sleep: 2. Occasionally Pain Interference with Therapy Activities: 1. Rarely or not at all Pain Interference with Day-to-Day Activities: 1. Rarely or not at all Vision/Perception  Vision - History Ability to See in Adequate Light: 0 Adequate Perception Perception: Within Functional Limits Praxis Praxis: Intact  Cognition Overall Cognitive Status: Within Functional Limits for tasks assessed Arousal/Alertness: Awake/alert Orientation Level: Oriented X4 Year: 2023 Month: June Day of Week: Correct Attention: Divided Focused Attention: Appears intact Divided Attention: Appears intact Memory: Appears intact Awareness: Appears intact Problem Solving: Appears intact Safety/Judgment: Appears intact Sensation Sensation Light Touch: Appears Intact Proprioception: Not tested Stereognosis: Not tested Coordination Gross Motor Movements are Fluid and Coordinated: Yes Coordination and Movement Description: NWB R LE Motor  Motor Motor: Within Functional Limits  Mobility Bed Mobility Supine to Sit: Independent Sit to Supine: Independent Transfers Transfers: Sit to Stand;Stand to Sit;Stand Pivot Transfers Sit to Stand: Independent with assistive device Stand to Sit: Independent with assistive device Stand Pivot Transfers: Independent with assistive device Transfer (Assistive device): Rolling walker Locomotion  Gait Ambulation: Yes Gait Assistance: Contact Guard/Touching assist Gait Distance (Feet): 200 Feet Assistive device: Rolling walker Gait Gait: Yes Gait Pattern: Impaired (hop to gait pattern d/t NWB RLE) Stairs / Additional Locomotion Stairs: Yes Stairs Assistance: Supervision/Verbal cueing Stair Management Technique: Two rails Number of Stairs: 20 Height of Stairs: 6 Ramp: Independent with assistive device Curb: Insurance claims handler Mobility: Yes Wheelchair Assistance: Independent with Camera operator: Both upper extremities Wheelchair Parts Management: Independent Distance: 500  Trunk/Postural Assessment  Cervical Assessment Cervical Assessment: Within Functional Limits Thoracic Assessment Thoracic Assessment: Within Functional Limits Lumbar Assessment Lumbar Assessment: Exceptions to WFL (LSO) Postural Control Postural Control: Within Functional Limits  Balance  Balance Balance Assessed: Yes Static Sitting Balance Static Sitting - Balance Support: No upper extremity supported Static Sitting - Level of Assistance: 7: Independent Dynamic Sitting Balance Dynamic Sitting - Balance Support: No upper extremity supported Dynamic Sitting - Level of Assistance: 7: Independent Static Standing Balance Static Standing - Balance Support: Bilateral upper extremity supported Static Standing - Level of Assistance: 6: Modified independent (Device/Increase time) Dynamic Standing Balance Dynamic Standing - Balance Support: During functional activity Dynamic Standing - Level of Assistance: 6: Modified independent (Device/Increase time) Extremity Assessment      RLE Assessment RLE Assessment: Exceptions to [facility] General Strength Comments: 4+/5 at hip and knee, ankle not tested d/t TMA LLE Assessment LLE Assessment: Within Functional Limits    Mickel Fuchs 09/30/2021, 3:58 PM

## 2021-09-30 NOTE — Progress Notes (Signed)
Occupational Therapy Session Note  Patient Details  Name: Kaylee Ward MRN: [id number] Date of Birth: 02/01/1991  Today's Date: 09/30/2021 OT Individual Time: 0900-1000 OT Individual Time Calculation (min): 60 min    Short Term Goals: Week 1:  OT Short Term Goal 1 (Week 1): STG = LTG due to ELOS  Skilled Therapeutic Interventions/Progress Updates:  Pt seen for am session for OT. Reports 5/10 pain R LE but pain meds just given. Ace wrap and dressing to R LE C/D/I. OT dscussed further NWB precautions in relation to A/IADL's and proceeded with session. OT set up make shift RW bag for item transport training on RW and pt able to amb to toilet and w/c with close S. Toileting with Dist S. Hand wahsing standing level sink side with close S. W/c skills to cafeteria with close S even in/out of elevator. OT transported RW to use in setting to address navigation, light purchased item transport, standing balance and tolerance and accessing fridge. OT training with safe hand and body placement for falls prevention and bridging techniques for item -transport along counter. Pt then practiced transfer in and out of booth with CGA. Once back in room, OT went to retrieve an actual walker bag and Neuropsych arrived and pt handed off for their session.    Therapy Documentation Precautions:  Precautions Precautions: Fall, Posterior Hip, Back Precaution Booklet Issued: No Precaution Comments: pt able to state all precautions correctly Required Braces or Orthoses: Spinal Brace Spinal Brace: Lumbar corset, Applied in sitting position Restrictions Weight Bearing Restrictions: Yes RLE Weight Bearing: Non weight bearing    Therapy/Group: Individual Therapy  Vicenta Dunning 09/30/2021, 12:03 PM
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
## 2021-10-01 NOTE — Progress Notes (Signed)
Occupational Therapy Session Note  Patient Details  Name: Kaylee Ward MRN: [id number] Date of Birth: 1991/02/23  Today's Date: 10/01/2021 OT Individual Time: 3235-5732 OT Individual Time Calculation (min): 43 min   Short Term Goals: Week 1:  OT Short Term Goal 1 (Week 1): STG = LTG due to ELOS  Skilled Therapeutic Interventions/Progress Updates:    Pt greeted seated in recliner and agreeable to OT treatment session. Discussed dc plan, home set-up, DME, and modifications for safe BADL participation at home. Pt ambulated to the bathroom w/ RW and toileting with successful void of urine mod I. Pt self-propelled wc in and out of elevator and to the cafeteria to get coffee. Educated on community reintegration and safety awareness from wc level. Pt demonstrated good safety awareness and wc propulsion throughout. Pt returned to room and ambulated back to recliner w/ RW mod I. Pt requested to see her photo of her R LE amputation. OT able to show patient her x-ray and photo and educated on general wound care and skin integrity. Pt left seated in recliner with needs met.   Therapy Documentation Precautions:  Precautions Precautions: Fall, Posterior Hip, Back Precaution Booklet Issued: No Precaution Comments: pt able to state all precautions correctly Required Braces or Orthoses: Spinal Brace Spinal Brace: Lumbar corset, Applied in sitting position Restrictions Weight Bearing Restrictions: Yes RLE Weight Bearing: Non weight bearing Pain: Pain Assessment Pain Scale: 0-10 Pain Score: 4 Pain Type: Acute pain Pain Location: Foot Pain Orientation: Right Pain Descriptors / Indicators: Aching;Throbbing;Shooting;Sharp Pain Frequency: Constant Pain Onset: On-going Pain Intervention(s): Repositioned     Therapy/Group: Individual Therapy  Valma Cava 10/01/2021, 8:47 AM

## 2021-10-01 NOTE — Progress Notes (Signed)
Physical Therapy Session Note  Patient Details  Name: Kaylee Ward MRN: [id number] Date of Birth: 1990/12/22  Today's Date: 10/01/2021 PT Individual Time: 1635-1730 PT Individual Time Calculation (min): 55 min   Short Term Goals: Week 1:  PT Short Term Goal 1 (Week 1): = LTGs due to ELOS  Skilled Therapeutic Interventions/Progress Updates:    Pt received sitting in recliner and agreeable to therapy session. Pt has been previously cleared to be mod-I in room. Pt reports no concerns regarding D/C and states she is ready. Pt did inquire about whether or not she was allowed to don LB clothing using figure-4 technique - discussed to move in this direction slowly but that yes as long as she does not flex her hip >90degrees it is not breaking hip precautions because it is performing hip abduction and external rotation. Pt able to recall other WBing restrictions without cuing. Pt dons LSO prior to initiating mobility without cuing - thearpist reviewed spine precautions with her and impacts on mobility.  Pt reports some fatigue from using grounds pass to go outside with her family earlier today, reporting she performed a lot of wheelchair mobility up/down long ramp to/from parking deck.   Sit<>stands using RW mod-I during session. Gait in/out bathroom mod-I using RW - no difficulty noticed with this task and pt very safe. Pt performed all toileting tasks mod-I using RW.   B UE w/c propulsion ~160ft x2 to/from main therapy gym mod-I.   Stand pivot transfer w/c<>EOM, not using RW to allow increase independence of transfers when pt doesn't have AD within reach - completed mod-I.   Pt reports she doesn't need HEP printout and demonstrates ability to recall the following exercises with therapist providing min cuing to ensure proper form/technique while maintaining hip precautions. - seated R LE long arc quad with isometric hold  - seated R LE hip flexion through small ROM <90degrees to prevent breaking  precautions - prone R LE hip extension with knee flexed to 90 degrees - L sidelying R LE hip abduction with pillow between knees to avoid excessive hip adduction - ankle ROM towards DF to prevent contracture, demonstrated visual of how to look for anterior tib contraction Educated pt on performing increased number of reps (~20) due to inability to safely provide resistance for some of these exercises.  Educated on importance of maintaining full R hip extension ROM since pt plans to sleep and sit in recliner majority of the time.  Educated on different WBing restrictions for her foot vs hip. Discussed that as those precautions are progressed/updated she needs to ensure she is complying with the most restrictive precaution.   Pt confirms she has practiced stairs many times and feels confident and safe performing them in preparation for D/C.  Wheelchair mobility back to room. Short distance gait ~85ft w/c>recliner using RW mod-I. Pt left seated in recliner with needs in reach and R LE elevated.  Therapy Documentation Precautions:  Precautions Precautions: Fall, Posterior Hip, Back Precaution Booklet Issued: No Precaution Comments: pt able to state all precautions correctly Required Braces or Orthoses: Spinal Brace Spinal Brace: Lumbar corset, Applied in sitting position Restrictions Weight Bearing Restrictions: Yes RLE Weight Bearing: Non weight bearing  Pain: No reports of pain throughout session.    Therapy/Group: Individual Therapy  Ginny Forth , PT, DPT, NCS, CSRS 10/01/2021, 3:17 PM

## 2021-10-01 NOTE — Progress Notes (Signed)
Mother at bedside and demonstrated dressing change, questions answered. Pt tolerated well. Discussed medications once pt returns home. Informed pt and family that discharge medications will be known at time of discharge and timing of medications will be documented so that will know when to give. Asked pt if going home on lovenox, said it was discussed but she had not heard one way or the other. Pt reports that is walking with therapy and doing a lot for herself. Confirmed by pt being made independent in room today.  All needs met, call bell in reach.
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
## 2021-10-02 LAB — CBC
HCT: 27.1 % — ABNORMAL LOW (ref 36.0–46.0)
Hemoglobin: 8.8 g/dL — ABNORMAL LOW (ref 12.0–15.0)
MCH: 31.3 pg (ref 26.0–34.0)
MCHC: 32.5 g/dL (ref 30.0–36.0)
MCV: 96.4 fL (ref 80.0–100.0)
Platelets: 445 10*3/uL — ABNORMAL HIGH (ref 150–400)
RBC: 2.81 MIL/uL — ABNORMAL LOW (ref 3.87–5.11)
RDW: 13.8 % (ref 11.5–15.5)
WBC: 19.1 10*3/uL — ABNORMAL HIGH (ref 4.0–10.5)
nRBC: 0 % (ref 0.0–0.2)

## 2021-10-02 LAB — BASIC METABOLIC PANEL
Anion gap: 12 (ref 5–15)
BUN: 19 mg/dL (ref 6–20)
CO2: 22 mmol/L (ref 22–32)
Calcium: 8.6 mg/dL — ABNORMAL LOW (ref 8.9–10.3)
Chloride: 99 mmol/L (ref 98–111)
Creatinine, Ser: 0.75 mg/dL (ref 0.44–1.00)
GFR, Estimated: >60 mL/min (ref >60)
Glucose, Bld: 103 mg/dL — ABNORMAL HIGH (ref 70–99)
Potassium: 4.5 mmol/L (ref 3.5–5.1)
Sodium: 133 mmol/L — ABNORMAL LOW (ref 135–145)

## 2021-10-02 MED ORDER — OXYCODONE HCL ER 15 MG PO T12A
15.0000 mg | EXTENDED_RELEASE_TABLET | Freq: Two times a day (BID) | ORAL | Status: DC
  Administered 2021-10-02 – 2021-10-03 (×2): 15 mg via ORAL
  Filled 2021-10-02 (×2): qty 1

## 2021-10-02 MED ORDER — RIVAROXABAN 10 MG PO TABS
10.0000 mg | ORAL_TABLET | Freq: Every day | ORAL | Status: DC
  Administered 2021-10-02: 10 mg via ORAL
  Filled 2021-10-02: qty 1

## 2021-10-02 MED ORDER — DEXAMETHASONE 4 MG PO TABS
4.0000 mg | ORAL_TABLET | Freq: Two times a day (BID) | ORAL | Status: DC
  Administered 2021-10-02 – 2021-10-03 (×2): 4 mg via ORAL
  Filled 2021-10-02 (×2): qty 1

## 2021-10-02 NOTE — Discharge Summary (Signed)
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 tolerance, balance, postural control as well as ability to compensate for deficits. He/She has had improvement in functional use RUE/LUE  and RLE/LLE as well as improvement in awareness.  Modified independent in the room.  Patient did inquire about whether or not she was allowed to don lower body clothing using figure 4 technique.  She was able to recall her weightbearing precautions.  Sit to stand rolling walker modified  independent.  She could propel her wheelchair independently.  Patient ambulates to the bathroom rolling walker toileting with successful void modified independent.  Full family teaching completed plan discharged home.       Disposition: Discharge to home    Diet: Regular  Special Instructions: No driving smoking or alcohol  Nonweightbearing right lower extremity with posterior hip precautions/back corset when out of bed  Medications at discharge. 1.  Tylenol as needed 2.  Xarelto daily through 10/17/2021 and stop 3.  Colace 100 mg twice daily 4.  Ferrous sulfate 325 mg p.o. twice daily 5.  Neurontin 800 mg p.o. 3 times  daily 6.  Lidoderm patch as directed 7.  Robaxin 1000 mg p.o. 4 times daily 8.  Multivitamin daily 9.  Oxycodone 5 to 10 mg every 4 hours as needed pain 10.  OxyContin 15 mg every 12 hours x1 week and stop 11.  Protonix 40 mg p.o. twice daily 12.  MiraLAX daily hold for loose stool 13.  Zinc sulfate 220 mg p.o. daily  30-35 minutes were spent completing discharge summary and discharge planning Discharge Instructions     Ambulatory referral to Physical Medicine Rehab   Complete by: As directed    Moderate complexity follow-up 1 to 2 weeks multitrauma        Follow-up Information     Lovorn, Aundra Millet, MD Follow up.   Specialty: Physical Medicine and Rehabilitation Why: Only as directed Contact information: [address] [place] [address] [phone]         Nadara Mustard, MD Follow up.   Specialty: Orthopedic Surgery Why: Call for appointment Contact information: [address] [place] [address] [phone]         Myrene Galas, MD Follow up.   Specialty: Orthopedic Surgery Why: Call for appointment Contact information: [address] [place] [address] [phone]         Julio Sicks, MD Follow up.   Specialty: Neurosurgery Why: Call for appointment Contact information: [address] [place] [address] [phone]                 Signed: Mcarthur Rossetti Maryssa Giampietro 10/03/2021, 5:29 AM
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
## 2021-10-02 NOTE — Progress Notes (Signed)
Physical Therapy Session Note  Patient Details  Name: Kaylee Ward MRN: [id number] Date of Birth: 06-04-1990  Today's Date: 10/02/2021 PT Individual Time: 1345-1430 PT Individual Time Calculation (min): 45 min   Short Term Goals: Week 1:  PT Short Term Goal 1 (Week 1): = LTGs due to ELOS  Skilled Therapeutic Interventions/Progress Updates: Pt presents sitting in recliner w/ LE s elevated and agreeable to therapy.  Pt manages all pillows in recliner.  Pt performed all transfers w/ mod I.  Pt amb during session w/ RW and mod I > 200'.  Pt performed w/c mobility w/ Mod I 300', managing all leg rests and brakes.  Pt negotiated ramped surface w/ W/C and mod I, and up/down 20 steps (4 x 5 times) w/ 2 rails and  supervision to mod I.  Pt transferred in and out of car w/ set-up assist.  Pt returned to room and resumed seated position in recliner.  All needs in reach     Therapy Documentation Precautions:  Precautions Precautions: Fall, Posterior Hip, Back Precaution Booklet Issued: No Precaution Comments: pt able to state all precautions correctly Required Braces or Orthoses: Spinal Brace Spinal Brace: Lumbar corset, Applied in sitting position Restrictions Weight Bearing Restrictions: Yes RLE Weight Bearing: Non weight bearing Other Position/Activity Restrictions: posterior THR precautions. General:   Vital Signs:   Pain:6/10 R foot w/ meds given. Pain Assessment Pain Scale: 0-10 Pain Score: 6  Pain Type: Surgical pain Pain Location: Foot Pain Orientation: Right Pain Descriptors / Indicators: Discomfort Pain Frequency: Constant Pain Onset: On-going Pain Intervention(s): Medication (See eMAR) Mobility: Bed Mobility Supine to Sit: Independent Sit to Supine: Independent Transfers Transfers: Sit to Stand;Stand to Sit;Stand Pivot Transfers Sit to Stand: Independent with assistive device Stand to Sit: Independent with assistive device Stand Pivot Transfers: Independent with  assistive device Transfer (Assistive device): Rolling walker Locomotion : Gait Ambulation: Yes Gait Assistance: Moderate Assistance - Patient 50-74% Gait Distance (Feet): 200 Feet Assistive device: Rolling walker Gait Gait: Yes Stairs / Additional Locomotion Stairs: Yes Stairs Assistance: Supervision/Verbal cueing Stair Management Technique: Two rails Number of Stairs: 20 Height of Stairs: 6 Curb: Supervision/Verbal cueing Wheelchair Mobility Wheelchair Mobility: Yes Wheelchair Assistance: Independent with Scientist, research (life sciences): Both upper extremities Wheelchair Parts Management: Independent Distance: >300  Trunk/Postural Assessment : Cervical Assessment Cervical Assessment: Within Functional Limits Thoracic Assessment Thoracic Assessment: Within Functional Limits Lumbar Assessment Lumbar Assessment: Exceptions to WFL (LSO) Postural Control Postural Control: Within Functional Limits  Balance: Balance Balance Assessed: Yes Static Sitting Balance Static Sitting - Balance Support: No upper extremity supported Static Sitting - Level of Assistance: 7: Independent Dynamic Sitting Balance Dynamic Sitting - Balance Support: No upper extremity supported Dynamic Sitting - Level of Assistance: 7: Independent Static Standing Balance Static Standing - Balance Support: Bilateral upper extremity supported Static Standing - Level of Assistance: 6: Modified independent (Device/Increase time) Dynamic Standing Balance Dynamic Standing - Balance Support: During functional activity Dynamic Standing - Level of Assistance: 6: Modified independent (Device/Increase time) Exercises:   Other Treatments:      Therapy/Group: Individual Therapy  Lucio Edward 10/02/2021, 2:31 PM

## 2021-10-03 ENCOUNTER — Other Ambulatory Visit (HOSPITAL_COMMUNITY): Payer: Self-pay

## 2021-10-03 MED ORDER — GABAPENTIN 400 MG PO CAPS
800.0000 mg | ORAL_CAPSULE | Freq: Three times a day (TID) | ORAL | 0 refills | Status: DC
  Filled 2021-10-03: qty 180, 30d supply, fill #0

## 2021-10-03 MED ORDER — ADULT MULTIVITAMIN W/MINERALS CH: 1.0000 | ORAL_TABLET | Freq: Every day | ORAL | Status: AC

## 2021-10-03 MED ORDER — ASCORBIC ACID 1000 MG PO TABS
1000.0000 mg | ORAL_TABLET | Freq: Every day | ORAL | 0 refills | Status: AC
  Filled 2021-10-03: qty 30, 30d supply, fill #0

## 2021-10-03 MED ORDER — PANTOPRAZOLE SODIUM 40 MG PO TBEC
40.0000 mg | DELAYED_RELEASE_TABLET | Freq: Two times a day (BID) | ORAL | 0 refills | Status: DC
  Filled 2021-10-03: qty 30, 15d supply, fill #0

## 2021-10-03 MED ORDER — ZINC SULFATE 220 (50 ZN) MG PO TABS
220.0000 mg | ORAL_TABLET | Freq: Every day | ORAL | 0 refills | Status: AC
  Filled 2021-10-03: qty 30, 30d supply, fill #0

## 2021-10-03 MED ORDER — LIDOCAINE 5 % EX PTCH
1.0000 | MEDICATED_PATCH | Freq: Every day | CUTANEOUS | 0 refills | Status: DC
  Filled 2021-10-03 (×2): qty 30, 30d supply, fill #0

## 2021-10-03 MED ORDER — DOCUSATE SODIUM 100 MG PO CAPS: 100.0000 mg | ORAL_CAPSULE | Freq: Two times a day (BID) | ORAL | 0 refills | Status: AC

## 2021-10-03 MED ORDER — POLYETHYLENE GLYCOL 3350 17 G PO PACK: 17.0000 g | PACK | Freq: Every day | ORAL | 0 refills | Status: AC

## 2021-10-03 MED ORDER — OXYCODONE HCL ER 10 MG PO T12A
10.0000 mg | EXTENDED_RELEASE_TABLET | Freq: Two times a day (BID) | ORAL | 0 refills | Status: DC
Start: 2021-10-03 — End: 2021-10-10
  Filled 2021-10-03 (×2): qty 14, 7d supply, fill #0

## 2021-10-03 MED ORDER — RIVAROXABAN 10 MG PO TABS
10.0000 mg | ORAL_TABLET | Freq: Every day | ORAL | 0 refills | Status: AC
  Filled 2021-10-03: qty 16, 16d supply, fill #0

## 2021-10-03 MED ORDER — OXYCODONE HCL 5 MG PO TABS
5.0000 mg | ORAL_TABLET | ORAL | 0 refills | Status: DC | PRN
  Filled 2021-10-03: qty 30, 5d supply, fill #0
  Filled 2021-10-03: qty 30, 3d supply, fill #0

## 2021-10-03 MED ORDER — OXYCODONE HCL ER 15 MG PO T12A
15.0000 mg | EXTENDED_RELEASE_TABLET | Freq: Two times a day (BID) | ORAL | 0 refills | Status: DC
  Filled 2021-10-03: qty 14, 7d supply, fill #0

## 2021-10-03 MED ORDER — ACETAMINOPHEN 325 MG PO TABS: 325.0000 mg | ORAL_TABLET | Freq: Four times a day (QID) | ORAL | Status: AC | PRN

## 2021-10-03 MED ORDER — FERROUS SULFATE 325 (65 FE) MG PO TABS
325.0000 mg | ORAL_TABLET | Freq: Two times a day (BID) | ORAL | 0 refills | Status: DC
  Filled 2021-10-03: qty 60, 30d supply, fill #0

## 2021-10-03 MED ORDER — METHOCARBAMOL 500 MG PO TABS
1000.0000 mg | ORAL_TABLET | Freq: Four times a day (QID) | ORAL | 0 refills | Status: AC
  Filled 2021-10-03: qty 240, 30d supply, fill #0
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
## 2021-10-03 NOTE — Progress Notes (Signed)
Inpatient Rehabilitation Discharge Medication Review by a Pharmacist  A complete drug regimen review was completed for this patient to identify any potential clinically significant medication issues.  High Risk Drug Classes Is patient taking? Indication by Medication  Antipsychotic No   Anticoagulant Yes Kathryne Hitch- VTE prophylaxis  Antibiotic No   Opioid Yes Oxycodone/tramadol - pain  Antiplatelet No   Hypoglycemics/insulin No   Vasoactive Medication No   Chemotherapy No   Other Yes Protonix - reflux Robaxin - spasms Neurontin  - neuropathy     Type of Medication Issue Identified Description of Issue Recommendation(s)  Drug Interaction(s) (clinically significant)     Duplicate Therapy     Allergy     No Medication Administration End Date     Incorrect Dose     Additional Drug Therapy Needed     Significant med changes from prior encounter (inform family/care partners about these prior to discharge).    Other       Clinically significant medication issues were identified that warrant physician communication and completion of prescribed/recommended actions by midnight of the next day:  No   Time spent performing this drug regimen review (minutes):  30  Makynli Stills BS, PharmD, BCPS Clinical Pharmacist 10/03/2021 11:00 AM  Contact: [phone] after 3 PM  "Be curious, not judgmental..." -Debbora Dus

## 2021-10-03 NOTE — TOC Benefit Eligibility Note (Signed)
Patient Advocate Encounter  Prior Authorization for Oxycontin 10 mg 12 hour has been approved.    PA# [id number] Confirmation #:[id number] W  Effective dates: 10/03/2021 through 11/02/2021      Roland Earl, CPhT Pharmacy Patient Advocate Specialist [facility] Patient Advocate Team Direct Number: [phone]  Fax: [phone]

## 2021-10-03 NOTE — Progress Notes (Signed)
Dressing changed, family trained and able to change dressing to right foot. Removed sutures (2) to right leg. Pt verbalizes an understanding of discharge medications, follow up appts, and discharge instructions. TC contacted for medications, equipment delivered.

## 2021-10-05 NOTE — Progress Notes (Signed)
Inpatient Rehabilitation Care Coordinator Discharge Note   Patient Details  Name: RAFEEF LAU MRN: [id number] Date of Birth: 1990-09-07   Discharge location: D/c to home with her mother ([address], [place] [address])  Length of Stay: 8 days  Discharge activity level: Mod I at w/c level  Home/community participation: Limited  Patient response CN:OBSJGG Literacy - How often do you need to have someone help you when you read instructions, pamphlets, or other written material from your doctor or pharmacy?: Never  Patient response EZ:MOQHUT Isolation - How often do you feel lonely or isolated from those around you?: Never  Services provided included: RD, MD, PT, OT, RN, TR, SLP, CM, Pharmacy, Neuropsych, SW  Financial Services:  Financial Services Utilized: Medicaid    Choices offered to/list presented to: Yes  Follow-up services arranged:  Other (Comment) (No therapies until WB status changes)   Patient response to transportation need: Is the patient able to respond to transportation needs?: Yes In the past 12 months, has lack of transportation kept you from medical appointments or from getting medications?: No In the past 12 months, has lack of transportation kept you from meetings, work, or from getting things needed for daily living?: No   Comments (or additional information):  Patient/Family verbalized understanding of follow-up arrangements:  Yes  Individual responsible for coordination of the follow-up plan: contact pt [phone]  Confirmed correct DME delivered: Gretchen Short 10/05/2021    Gretchen Short

## 2021-10-07 ENCOUNTER — Telehealth: Payer: Self-pay | Admitting: Orthopedic Surgery

## 2021-10-08 ENCOUNTER — Other Ambulatory Visit: Payer: Self-pay | Admitting: Orthopaedic Surgery

## 2021-10-08 ENCOUNTER — Telehealth: Payer: Medicaid Other | Admitting: Nurse Practitioner

## 2021-10-08 DIAGNOSIS — T07XXXA Unspecified multiple injuries, initial encounter: Secondary | ICD-10-CM | POA: Diagnosis not present

## 2021-10-10 ENCOUNTER — Other Ambulatory Visit (HOSPITAL_COMMUNITY): Payer: Self-pay

## 2021-10-10 ENCOUNTER — Ambulatory Visit (INDEPENDENT_AMBULATORY_CARE_PROVIDER_SITE_OTHER): Payer: Medicaid Other | Admitting: Orthopedic Surgery

## 2021-10-10 DIAGNOSIS — S91309A Unspecified open wound, unspecified foot, initial encounter: Secondary | ICD-10-CM

## 2021-10-10 DIAGNOSIS — S98131A Complete traumatic amputation of one right lesser toe, initial encounter: Secondary | ICD-10-CM

## 2021-10-10 DIAGNOSIS — S91301A Unspecified open wound, right foot, initial encounter: Secondary | ICD-10-CM

## 2021-10-10 DIAGNOSIS — Z89421 Acquired absence of other right toe(s): Secondary | ICD-10-CM

## 2021-10-10 MED ORDER — OXYCODONE HCL ER 10 MG PO T12A
10.0000 mg | EXTENDED_RELEASE_TABLET | Freq: Two times a day (BID) | ORAL | 0 refills | Status: DC
  Filled 2021-10-10: qty 14, 7d supply, fill #0

## 2021-10-10 MED ORDER — OXYCODONE HCL 5 MG PO TABS
5.0000 mg | ORAL_TABLET | Freq: Four times a day (QID) | ORAL | 0 refills | Status: DC | PRN
  Filled 2021-10-10: qty 30, 4d supply, fill #0

## 2021-10-11 ENCOUNTER — Encounter: Payer: Self-pay | Admitting: Orthopedic Surgery

## 2021-10-12 ENCOUNTER — Telehealth: Payer: Self-pay

## 2021-10-12 NOTE — Telephone Encounter (Signed)
Patient calling to get advise on what to do about swelling in legs. She states she is propping them up, icing them and everything she has been told to do. She wants to know if she can just get water pills from over the counter to help relieve the swelling. Please advise

## 2021-10-13 NOTE — Telephone Encounter (Signed)
Swelling is likely from the gabapentin- unfortunately, it's not likely going away due to the nerve pain meds/gabapentin and we really need to try and wean down the gabapentin for nerve pain, unfortunately, if it's bad.   I suggest reducing gabapentin to 400 mg 3x/day for nerve pain- and see if that helps the swelling.   Fluid pills will NOT fix the swelling esp if it's due to medications.   If it doesn't improve, she needs to be scanned for a clot- DVT.  Let us know- thanks- ML

## 2021-10-14 NOTE — Telephone Encounter (Signed)
Spoke with patient and informed her of the information below. Asked her to call us on 10/19/21 to inform us how she is doing

## 2021-10-17 ENCOUNTER — Telehealth: Payer: Self-pay

## 2021-10-17 ENCOUNTER — Other Ambulatory Visit: Payer: Self-pay | Admitting: Orthopedic Surgery

## 2021-10-17 ENCOUNTER — Other Ambulatory Visit (HOSPITAL_COMMUNITY): Payer: Self-pay

## 2021-10-17 ENCOUNTER — Telehealth: Payer: Self-pay | Admitting: Orthopedic Surgery

## 2021-10-17 MED ORDER — OXYCODONE HCL 5 MG PO TABS
5.0000 mg | ORAL_TABLET | Freq: Four times a day (QID) | ORAL | 0 refills | Status: DC | PRN
  Filled 2021-10-17: qty 30, 4d supply, fill #0

## 2021-10-17 MED ORDER — OXYCODONE HCL ER 10 MG PO T12A
10.0000 mg | EXTENDED_RELEASE_TABLET | Freq: Two times a day (BID) | ORAL | 0 refills | Status: DC
  Filled 2021-10-17: qty 14, 7d supply, fill #0

## 2021-10-17 MED ORDER — PANTOPRAZOLE SODIUM 40 MG PO TBEC
40.0000 mg | DELAYED_RELEASE_TABLET | Freq: Two times a day (BID) | ORAL | 0 refills | Status: DC
  Filled 2021-10-17: qty 30, 15d supply, fill #0

## 2021-10-17 NOTE — Telephone Encounter (Signed)
Refill Pantoprazole °

## 2021-10-17 NOTE — Telephone Encounter (Signed)
PMP report:   Filled  Written  ID  Drug  QTY  Days  Prescriber  RX #  Dispenser  Refill  Daily Dose*  Pymt Type  PMP  10/10/2021 10/10/2021 3  Oxycodone Hcl (Ir) 5 Mg Tablet 30.00 4 Ma Dud [id number] Wes (0126) 0/0 56.25 MME Other [place] 10/10/2021 10/10/2021 3  Oxycontin Er 10 Mg Tablet 14.00 7 Ma Dud [id number] Wes (0126) 0/0 30.00 MME Other [place]  Patient call requesting refill of pain medications. Patient has not been seen for follow up in the office. She has been advised to call her surgeon for refills. Per Riley Lam NP. (Appointment on 10/20/2021).  Thank you

## 2021-10-17 NOTE — Telephone Encounter (Signed)
Refill request for Xarelto. Recent discharge patient

## 2021-10-17 NOTE — Telephone Encounter (Signed)
Rx's sent by Dr. Lajoyce Corners from pharmacy request this morning.

## 2021-10-17 NOTE — Telephone Encounter (Signed)
Pt called requesting refill of oxycodone. Pt states Rehab told her to get refill from her surgeon. Please send to pharmacy on file.Pt phone number is [phone]

## 2021-10-18 MED ORDER — RIVAROXABAN 10 MG PO TABS: 10.0000 mg | ORAL_TABLET | Freq: Every day | ORAL | 3 refills | Status: AC

## 2021-10-18 NOTE — Telephone Encounter (Signed)
Sent in to pharmacy in [place]- wasn't clear if it's the right pharmacy.

## 2021-10-20 ENCOUNTER — Encounter: Payer: Medicaid Other | Attending: Registered Nurse | Admitting: Registered Nurse

## 2021-10-20 ENCOUNTER — Other Ambulatory Visit (HOSPITAL_COMMUNITY): Payer: Self-pay

## 2021-10-20 ENCOUNTER — Encounter: Payer: Self-pay | Admitting: Registered Nurse

## 2021-10-20 VITALS — BP 118/75 | HR 100 | Ht 65.0 in

## 2021-10-20 DIAGNOSIS — Z79891 Long term (current) use of opiate analgesic: Secondary | ICD-10-CM | POA: Insufficient documentation

## 2021-10-20 DIAGNOSIS — Y93I9 Activity, other involving external motion: Secondary | ICD-10-CM | POA: Insufficient documentation

## 2021-10-20 DIAGNOSIS — Z5181 Encounter for therapeutic drug level monitoring: Secondary | ICD-10-CM | POA: Insufficient documentation

## 2021-10-20 DIAGNOSIS — G894 Chronic pain syndrome: Secondary | ICD-10-CM | POA: Insufficient documentation

## 2021-10-20 DIAGNOSIS — R21 Rash and other nonspecific skin eruption: Secondary | ICD-10-CM | POA: Diagnosis not present

## 2021-10-20 DIAGNOSIS — T07XXXA Unspecified multiple injuries, initial encounter: Secondary | ICD-10-CM | POA: Insufficient documentation

## 2021-10-20 DIAGNOSIS — Z79899 Other long term (current) drug therapy: Secondary | ICD-10-CM | POA: Insufficient documentation

## 2021-10-20 DIAGNOSIS — M47812 Spondylosis without myelopathy or radiculopathy, cervical region: Secondary | ICD-10-CM | POA: Insufficient documentation

## 2021-10-20 DIAGNOSIS — S92311D Displaced fracture of first metatarsal bone, right foot, subsequent encounter for fracture with routine healing: Secondary | ICD-10-CM | POA: Diagnosis not present

## 2021-10-20 DIAGNOSIS — S91309A Unspecified open wound, unspecified foot, initial encounter: Secondary | ICD-10-CM

## 2021-10-20 DIAGNOSIS — S98131A Complete traumatic amputation of one right lesser toe, initial encounter: Secondary | ICD-10-CM | POA: Insufficient documentation

## 2021-10-20 DIAGNOSIS — S92321D Displaced fracture of second metatarsal bone, right foot, subsequent encounter for fracture with routine healing: Secondary | ICD-10-CM | POA: Diagnosis not present

## 2021-10-20 MED ORDER — OXYCODONE HCL 5 MG PO TABS
ORAL_TABLET | ORAL | 0 refills | Status: DC
  Filled 2021-10-20: qty 42, 21d supply, fill #0

## 2021-10-20 NOTE — Patient Instructions (Signed)
Please obtain a Primary Care Physician
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
## 2021-10-21 ENCOUNTER — Other Ambulatory Visit (HOSPITAL_COMMUNITY): Payer: Self-pay

## 2021-10-25 LAB — DRUG TOX MONITOR 1 W/CONF, ORAL FLD
Amphetamines: NEGATIVE ng/mL (ref <10)
Barbiturates: NEGATIVE ng/mL (ref <10)
Benzodiazepines: NEGATIVE ng/mL (ref <0.50)
Buprenorphine: NEGATIVE ng/mL (ref <0.10)
Cocaine: NEGATIVE ng/mL (ref <5.0)
Codeine: NEGATIVE ng/mL (ref <2.5)
Cotinine: >250 ng/mL — ABNORMAL HIGH (ref <5.0)
Dihydrocodeine: NEGATIVE ng/mL (ref <2.5)
Fentanyl: NEGATIVE ng/mL (ref <0.10)
Heroin Metabolite: NEGATIVE ng/mL (ref <1.0)
Hydrocodone: NEGATIVE ng/mL (ref <2.5)
Hydromorphone: NEGATIVE ng/mL (ref <2.5)
MARIJUANA: POSITIVE ng/mL — AB (ref <2.5)
MDMA: NEGATIVE ng/mL (ref <10)
Meprobamate: NEGATIVE ng/mL (ref <2.5)
Methadone: NEGATIVE ng/mL (ref <5.0)
Morphine: NEGATIVE ng/mL (ref <2.5)
Nicotine Metabolite: POSITIVE ng/mL — AB (ref <5.0)
Norhydrocodone: NEGATIVE ng/mL (ref <2.5)
Noroxycodone: 40.2 ng/mL — ABNORMAL HIGH (ref <2.5)
Opiates: POSITIVE ng/mL — AB (ref <2.5)
Oxycodone: 232.5 ng/mL — ABNORMAL HIGH (ref <2.5)
Oxymorphone: 3.8 ng/mL — ABNORMAL HIGH (ref <2.5)
Phencyclidine: NEGATIVE ng/mL (ref <10)
THC: 4.4 ng/mL — ABNORMAL HIGH (ref <2.5)
Tapentadol: NEGATIVE ng/mL (ref <5.0)
Tramadol: NEGATIVE ng/mL (ref <5.0)
Zolpidem: NEGATIVE ng/mL (ref <5.0)

## 2021-10-25 LAB — DRUG TOX ALC METAB W/CON, ORAL FLD: Alcohol Metabolite: NEGATIVE ng/mL (ref <25)

## 2021-10-28 ENCOUNTER — Other Ambulatory Visit (HOSPITAL_COMMUNITY): Payer: Self-pay

## 2021-10-29 NOTE — Consult Note (Signed)
#  [id number] 

## 2021-10-30 ENCOUNTER — Encounter: Payer: Self-pay | Admitting: Registered Nurse
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
## 2021-11-03 ENCOUNTER — Ambulatory Visit (INDEPENDENT_AMBULATORY_CARE_PROVIDER_SITE_OTHER): Payer: Medicaid Other | Admitting: Orthopedic Surgery

## 2021-11-03 DIAGNOSIS — S91309A Unspecified open wound, unspecified foot, initial encounter: Secondary | ICD-10-CM

## 2021-11-04 ENCOUNTER — Telehealth: Payer: Self-pay | Admitting: *Deleted

## 2021-11-04 NOTE — Telephone Encounter (Signed)
Oral swab drug screen was consistent for prescribed medications. However it is positive for THC/Marijuana as well.

## 2021-11-08 ENCOUNTER — Encounter: Payer: Self-pay | Admitting: Orthopedic Surgery
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
## 2021-11-10 ENCOUNTER — Other Ambulatory Visit (HOSPITAL_COMMUNITY): Payer: Self-pay

## 2021-11-17 ENCOUNTER — Ambulatory Visit (INDEPENDENT_AMBULATORY_CARE_PROVIDER_SITE_OTHER): Payer: Medicaid Other | Admitting: Orthopedic Surgery

## 2021-11-17 ENCOUNTER — Encounter: Payer: Self-pay | Admitting: Orthopedic Surgery

## 2021-11-17 DIAGNOSIS — S91309A Unspecified open wound, unspecified foot, initial encounter: Secondary | ICD-10-CM

## 2021-11-17 DIAGNOSIS — S98131A Complete traumatic amputation of one right lesser toe, initial encounter: Secondary | ICD-10-CM

## 2021-11-17 NOTE — Progress Notes (Signed)
 -------------    SDW INSTRUCTIONS:  Your procedure is scheduled on 8/4. Please report to [facility] Main Entrance "A" at 0700 A.M., and check in at the Admitting office. Call this number if you have problems the morning of surgery: [phone]   Remember: Do not eat or drink after midnight the night before your surgery   Medications to take morning of surgery with a sip of water include:  acetaminophen (TYLENOL) if needed gabapentin (NEURONTIN) methocarbamol (ROBAXIN)  oxyCODONE (OXY IR/ROXICODONE)  pantoprazole (PROTONIX)      As of today, STOP taking any rivaroxaban (XARELTO), Aleve, Naproxen, Ibuprofen, Motrin, Advil, Goody's, BC's, all herbal medications, fish oil, and all vitamins.    The Morning of Surgery Do not wear jewelry, make-up or nail polish. Do not wear lotions, powders, or perfumes, or deodorant Do not bring valuables to the hospital. [facility] is not responsible for any belongings or valuables.  If you are a smoker, DO NOT Smoke 24 hours prior to surgery  If you wear a CPAP at night please bring your mask the morning of surgery   Remember that you must have someone to transport you home after your surgery, and remain with you for 24 hours if you are discharged the same day.  Please bring cases for contacts, glasses, hearing aids, dentures or bridgework because it cannot be worn into surgery.   Patients discharged the day of surgery will not be allowed to drive home.   Please shower the NIGHT BEFORE/MORNING OF SURGERY (use antibacterial soap like DIAL soap if possible). Wear comfortable clothes the morning of surgery. Oral Hygiene is also important to reduce your risk of infection.  Remember - BRUSH YOUR TEETH THE MORNING OF SURGERY WITH YOUR REGULAR TOOTHPASTE  Patient denies shortness of breath, fever, cough and chest pain.
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
## 2021-11-18 ENCOUNTER — Ambulatory Visit (HOSPITAL_COMMUNITY): Admission: RE | Admit: 2021-11-18 | Payer: Medicaid Other | Source: Home / Self Care | Admitting: Orthopedic Surgery

## 2021-11-18 ENCOUNTER — Other Ambulatory Visit (HOSPITAL_COMMUNITY): Payer: Self-pay

## 2021-11-18 ENCOUNTER — Other Ambulatory Visit: Payer: Self-pay

## 2021-11-18 ENCOUNTER — Telehealth: Payer: Self-pay | Admitting: Orthopedic Surgery

## 2021-11-18 ENCOUNTER — Encounter (HOSPITAL_COMMUNITY): Admission: RE | Payer: Self-pay | Source: Home / Self Care

## 2021-11-18 SURGERY — IRRIGATION AND DEBRIDEMENT EXTREMITY
Anesthesia: Choice | Laterality: Right

## 2021-11-18 MED ORDER — SILVER SULFADIAZINE 1 % EX CREA
1.0000 | TOPICAL_CREAM | Freq: Every day | CUTANEOUS | 0 refills | Status: DC
  Filled 2021-11-18 – 2021-11-21 (×2): qty 50, 30d supply, fill #0
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
## 2021-11-18 NOTE — Telephone Encounter (Signed)
Pt called requesting silverdem cream. Please cream [facility] outpatient pharmacy. Pt phone number is [phone]

## 2021-11-18 NOTE — Telephone Encounter (Signed)
Order ok per Dr. Lajoyce Corners sent to pharm.

## 2021-11-21 ENCOUNTER — Other Ambulatory Visit (HOSPITAL_COMMUNITY): Payer: Self-pay

## 2021-11-22 ENCOUNTER — Other Ambulatory Visit (HOSPITAL_COMMUNITY): Payer: Self-pay

## 2021-11-23 ENCOUNTER — Other Ambulatory Visit (HOSPITAL_COMMUNITY): Payer: Self-pay

## 2021-11-23 ENCOUNTER — Other Ambulatory Visit: Payer: Self-pay | Admitting: Physical Medicine and Rehabilitation

## 2021-11-24 ENCOUNTER — Other Ambulatory Visit (HOSPITAL_COMMUNITY): Payer: Self-pay

## 2021-11-24 MED ORDER — FERROUS SULFATE 325 (65 FE) MG PO TABS
325.0000 mg | ORAL_TABLET | Freq: Two times a day (BID) | ORAL | 1 refills | Status: AC
  Filled 2021-11-24: qty 60, 30d supply, fill #0

## 2021-11-24 MED ORDER — PANTOPRAZOLE SODIUM 40 MG PO TBEC
40.0000 mg | DELAYED_RELEASE_TABLET | Freq: Two times a day (BID) | ORAL | 1 refills | Status: AC
  Filled 2021-11-24: qty 60, 30d supply, fill #0

## 2021-11-24 MED ORDER — GABAPENTIN 400 MG PO CAPS
800.0000 mg | ORAL_CAPSULE | Freq: Three times a day (TID) | ORAL | 5 refills | Status: AC
  Filled 2021-11-24: qty 180, 30d supply, fill #0

## 2021-12-06 ENCOUNTER — Encounter (HOSPITAL_COMMUNITY): Payer: Self-pay | Admitting: Orthopedic Surgery

## 2021-12-07 ENCOUNTER — Other Ambulatory Visit: Payer: Self-pay

## 2021-12-07 ENCOUNTER — Encounter (HOSPITAL_COMMUNITY): Payer: Self-pay | Admitting: Orthopedic Surgery

## 2021-12-07 ENCOUNTER — Ambulatory Visit (HOSPITAL_COMMUNITY)
Admission: RE | Admit: 2021-12-07 | Discharge: 2021-12-07 | Disposition: A | Discharge status: Home / Self Care | Payer: Medicaid Other | Attending: Orthopedic Surgery | Admitting: Orthopedic Surgery

## 2021-12-07 ENCOUNTER — Ambulatory Visit (HOSPITAL_COMMUNITY): Payer: Medicaid Other

## 2021-12-07 ENCOUNTER — Ambulatory Visit (HOSPITAL_BASED_OUTPATIENT_CLINIC_OR_DEPARTMENT_OTHER): Payer: Medicaid Other

## 2021-12-07 ENCOUNTER — Other Ambulatory Visit (HOSPITAL_COMMUNITY): Payer: Self-pay

## 2021-12-07 ENCOUNTER — Encounter (HOSPITAL_COMMUNITY)
Admission: RE | Disposition: A | Discharge status: Home / Self Care | Payer: Self-pay | Source: Home / Self Care | Attending: Orthopedic Surgery

## 2021-12-07 DIAGNOSIS — F17210 Nicotine dependence, cigarettes, uncomplicated: Secondary | ICD-10-CM | POA: Insufficient documentation

## 2021-12-07 DIAGNOSIS — K219 Gastro-esophageal reflux disease without esophagitis: Secondary | ICD-10-CM | POA: Insufficient documentation

## 2021-12-07 DIAGNOSIS — D649 Anemia, unspecified: Secondary | ICD-10-CM

## 2021-12-07 DIAGNOSIS — Z89431 Acquired absence of right foot: Secondary | ICD-10-CM | POA: Diagnosis not present

## 2021-12-07 DIAGNOSIS — T8781 Dehiscence of amputation stump: Secondary | ICD-10-CM | POA: Insufficient documentation

## 2021-12-07 DIAGNOSIS — S91301A Unspecified open wound, right foot, initial encounter: Secondary | ICD-10-CM

## 2021-12-07 HISTORY — PX: I & D EXTREMITY: SHX5045

## 2021-12-07 HISTORY — DX: Anemia, unspecified: D64.9

## 2021-12-07 HISTORY — DX: Gastro-esophageal reflux disease without esophagitis: K21.9

## 2021-12-07 LAB — CBC
HCT: 39.1 % (ref 36.0–46.0)
Hemoglobin: 13.1 g/dL (ref 12.0–15.0)
MCH: 30.1 pg (ref 26.0–34.0)
MCHC: 33.5 g/dL (ref 30.0–36.0)
MCV: 89.9 fL (ref 80.0–100.0)
Platelets: 262 10*3/uL (ref 150–400)
RBC: 4.35 MIL/uL (ref 3.87–5.11)
RDW: 12.2 % (ref 11.5–15.5)
WBC: 7.4 10*3/uL (ref 4.0–10.5)
nRBC: 0 % (ref 0.0–0.2)

## 2021-12-07 LAB — BASIC METABOLIC PANEL
Anion gap: 8 (ref 5–15)
BUN: 7 mg/dL (ref 6–20)
CO2: 26 mmol/L (ref 22–32)
Calcium: 9 mg/dL (ref 8.9–10.3)
Chloride: 104 mmol/L (ref 98–111)
Creatinine, Ser: 0.65 mg/dL (ref 0.44–1.00)
GFR, Estimated: >60 mL/min (ref >60)
Glucose, Bld: 101 mg/dL — ABNORMAL HIGH (ref 70–99)
Potassium: 3.9 mmol/L (ref 3.5–5.1)
Sodium: 138 mmol/L (ref 135–145)

## 2021-12-07 SURGERY — IRRIGATION AND DEBRIDEMENT EXTREMITY
Anesthesia: General | Site: Foot | Laterality: Right

## 2021-12-07 MED ORDER — MIDAZOLAM HCL 2 MG/2ML IJ SOLN
INTRAMUSCULAR | Status: AC
  Filled 2021-12-07: qty 2

## 2021-12-07 MED ORDER — PROPOFOL 10 MG/ML IV BOLUS
INTRAVENOUS | Status: AC
  Filled 2021-12-07: qty 20

## 2021-12-07 MED ORDER — FENTANYL CITRATE (PF) 250 MCG/5ML IJ SOLN
INTRAMUSCULAR | Status: AC
  Filled 2021-12-07: qty 5

## 2021-12-07 MED ORDER — 0.9 % SODIUM CHLORIDE (POUR BTL) OPTIME
TOPICAL | Status: DC | PRN
  Administered 2021-12-07: 1000 mL

## 2021-12-07 MED ORDER — ONDANSETRON HCL 4 MG/2ML IJ SOLN: 4.0000 mg | Freq: Once | INTRAMUSCULAR | Status: DC | PRN

## 2021-12-07 MED ORDER — HYDROMORPHONE HCL 1 MG/ML IJ SOLN
INTRAMUSCULAR | Status: AC
  Filled 2021-12-07: qty 0.5

## 2021-12-07 MED ORDER — ACETAMINOPHEN 160 MG/5ML PO SOLN: 325.0000 mg | ORAL | Status: DC | PRN

## 2021-12-07 MED ORDER — HYDROMORPHONE HCL 1 MG/ML IJ SOLN
INTRAMUSCULAR | Status: AC
  Administered 2021-12-07: 1 mg via INTRAVENOUS
  Filled 2021-12-07: qty 1

## 2021-12-07 MED ORDER — ACETAMINOPHEN 10 MG/ML IV SOLN: 1000.0000 mg | Freq: Once | INTRAVENOUS | Status: DC

## 2021-12-07 MED ORDER — KETOROLAC TROMETHAMINE 15 MG/ML IJ SOLN: 15.0000 mg | Freq: Once | INTRAMUSCULAR | Status: AC

## 2021-12-07 MED ORDER — OXYCODONE HCL 5 MG/5ML PO SOLN: 5.0000 mg | Freq: Once | ORAL | Status: DC | PRN

## 2021-12-07 MED ORDER — FENTANYL CITRATE (PF) 100 MCG/2ML IJ SOLN: 25.0000 ug | INTRAMUSCULAR | Status: DC | PRN

## 2021-12-07 MED ORDER — HYDROMORPHONE HCL 1 MG/ML IJ SOLN: 0.5000 mg | INTRAMUSCULAR | Status: DC | PRN

## 2021-12-07 MED ORDER — HYDROMORPHONE HCL 1 MG/ML IJ SOLN
INTRAMUSCULAR | Status: DC | PRN
  Administered 2021-12-07: .5 mg via INTRAVENOUS

## 2021-12-07 MED ORDER — KETOROLAC TROMETHAMINE 15 MG/ML IJ SOLN
INTRAMUSCULAR | Status: AC
  Administered 2021-12-07: 15 mg via INTRAVENOUS
  Filled 2021-12-07: qty 1

## 2021-12-07 MED ORDER — LIDOCAINE 2% (20 MG/ML) 5 ML SYRINGE
INTRAMUSCULAR | Status: DC | PRN
  Administered 2021-12-07: 100 mg via INTRAVENOUS

## 2021-12-07 MED ORDER — ONDANSETRON HCL 4 MG/2ML IJ SOLN
INTRAMUSCULAR | Status: DC | PRN
  Administered 2021-12-07: 4 mg via INTRAVENOUS

## 2021-12-07 MED ORDER — FENTANYL CITRATE (PF) 250 MCG/5ML IJ SOLN
INTRAMUSCULAR | Status: DC | PRN
Start: 2021-12-07 — End: 2021-12-07
  Administered 2021-12-07 (×3): 50 ug via INTRAVENOUS
  Administered 2021-12-07: 100 ug via INTRAVENOUS

## 2021-12-07 MED ORDER — CEFAZOLIN SODIUM-DEXTROSE 2-4 GM/100ML-% IV SOLN
2.0000 g | INTRAVENOUS | Status: AC
  Administered 2021-12-07: 2 g via INTRAVENOUS
  Filled 2021-12-07: qty 100

## 2021-12-07 MED ORDER — OXYCODONE HCL 5 MG PO TABS: 5.0000 mg | ORAL_TABLET | Freq: Once | ORAL | Status: DC | PRN

## 2021-12-07 MED ORDER — MEPERIDINE HCL 25 MG/ML IJ SOLN: 6.2500 mg | INTRAMUSCULAR | Status: DC | PRN

## 2021-12-07 MED ORDER — CHLORHEXIDINE GLUCONATE 0.12 % MT SOLN
15.0000 mL | Freq: Once | OROMUCOSAL | Status: AC
  Administered 2021-12-07: 15 mL via OROMUCOSAL
  Filled 2021-12-07: qty 15

## 2021-12-07 MED ORDER — LACTATED RINGERS IV SOLN: INTRAVENOUS | Status: DC

## 2021-12-07 MED ORDER — DEXAMETHASONE SODIUM PHOSPHATE 10 MG/ML IJ SOLN
INTRAMUSCULAR | Status: DC | PRN
  Administered 2021-12-07: 4 mg via INTRAVENOUS

## 2021-12-07 MED ORDER — OXYCODONE-ACETAMINOPHEN 5-325 MG PO TABS
1.0000 | ORAL_TABLET | ORAL | 0 refills | Status: DC | PRN
  Filled 2021-12-07: qty 30, 5d supply, fill #0

## 2021-12-07 MED ORDER — DEXAMETHASONE SODIUM PHOSPHATE 10 MG/ML IJ SOLN
INTRAMUSCULAR | Status: AC
Start: 2021-12-07 — End: ?
  Filled 2021-12-07: qty 1

## 2021-12-07 MED ORDER — PROPOFOL 10 MG/ML IV BOLUS
INTRAVENOUS | Status: DC | PRN
  Administered 2021-12-07: 200 mg via INTRAVENOUS

## 2021-12-07 MED ORDER — DEXMEDETOMIDINE (PRECEDEX) IN NS 20 MCG/5ML (4 MCG/ML) IV SYRINGE
PREFILLED_SYRINGE | INTRAVENOUS | Status: DC | PRN
  Administered 2021-12-07: 12 ug via INTRAVENOUS

## 2021-12-07 MED ORDER — ACETAMINOPHEN 325 MG PO TABS: 325.0000 mg | ORAL_TABLET | ORAL | Status: DC | PRN

## 2021-12-07 MED ORDER — HYDROMORPHONE HCL 1 MG/ML IJ SOLN
1.0000 mg | INTRAMUSCULAR | Status: DC | PRN
  Administered 2021-12-07: 1 mg via INTRAVENOUS

## 2021-12-07 MED ORDER — ORAL CARE MOUTH RINSE
15.0000 mL | Freq: Once | OROMUCOSAL | Status: AC
Start: 2021-12-07 — End: 2021-12-07

## 2021-12-07 MED ORDER — MIDAZOLAM HCL 2 MG/2ML IJ SOLN
2.0000 mg | Freq: Once | INTRAMUSCULAR | Status: AC
  Administered 2021-12-07: 2 mg via INTRAVENOUS

## 2021-12-07 MED ORDER — LIDOCAINE 2% (20 MG/ML) 5 ML SYRINGE
INTRAMUSCULAR | Status: AC
  Filled 2021-12-07: qty 5

## 2021-12-07 MED ORDER — ONDANSETRON HCL 4 MG/2ML IJ SOLN
INTRAMUSCULAR | Status: AC
  Filled 2021-12-07: qty 2

## 2021-12-07 SURGICAL SUPPLY — 42 items
BAG COUNTER SPONGE SURGICOUNT (BAG) IMPLANT
BLADE AVERAGE 25X9 (BLADE) IMPLANT
BLADE SURG 21 STRL SS (BLADE) ×1 IMPLANT
BNDG COHESIVE 6X5 TAN STRL LF (GAUZE/BANDAGES/DRESSINGS) IMPLANT
BNDG ELASTIC 4X5.8 VLCR NS LF (GAUZE/BANDAGES/DRESSINGS) IMPLANT
BNDG GAUZE ELAST 4 BULKY (GAUZE/BANDAGES/DRESSINGS) ×2 IMPLANT
COVER SURGICAL LIGHT HANDLE (MISCELLANEOUS) ×2 IMPLANT
DRAPE U-SHAPE 47X51 STRL (DRAPES) ×1 IMPLANT
DRESSING PEEL AND PLC PRVNA 13 (GAUZE/BANDAGES/DRESSINGS) IMPLANT
DRESSING PREVENA PLUS CUSTOM (GAUZE/BANDAGES/DRESSINGS) IMPLANT
DRESSING VERAFLO CLEANS CC MED (GAUZE/BANDAGES/DRESSINGS) IMPLANT
DRSG ADAPTIC 3X8 NADH LF (GAUZE/BANDAGES/DRESSINGS) ×1 IMPLANT
DRSG PEEL AND PLACE PREVENA 13 (GAUZE/BANDAGES/DRESSINGS) ×1
DRSG PREVENA PLUS CUSTOM (GAUZE/BANDAGES/DRESSINGS)
DRSG VERAFLO CLEANSE CC MED (GAUZE/BANDAGES/DRESSINGS)
DURAPREP 26ML APPLICATOR (WOUND CARE) ×1 IMPLANT
ELECT REM PT RETURN 9FT ADLT (ELECTROSURGICAL) ×1
ELECTRODE REM PT RTRN 9FT ADLT (ELECTROSURGICAL) IMPLANT
GAUZE SPONGE 4X4 12PLY STRL (GAUZE/BANDAGES/DRESSINGS) ×1 IMPLANT
GLOVE BIOGEL PI IND STRL 9 (GLOVE) ×1 IMPLANT
GLOVE BIOGEL PI INDICATOR 9 (GLOVE) ×1
GLOVE SURG ORTHO 9.0 STRL STRW (GLOVE) ×1 IMPLANT
GOWN STRL REUS W/ TWL XL LVL3 (GOWN DISPOSABLE) ×2 IMPLANT
GOWN STRL REUS W/TWL XL LVL3 (GOWN DISPOSABLE) ×2
GRAFT SKIN WND MICRO 38 (Tissue) IMPLANT
GRAFT SKIN WND OMEGA3 SB 7X10 (Tissue) IMPLANT
HANDPIECE INTERPULSE COAX TIP (DISPOSABLE)
KIT BASIN OR (CUSTOM PROCEDURE TRAY) ×1 IMPLANT
KIT TURNOVER KIT B (KITS) ×1 IMPLANT
MANIFOLD NEPTUNE II (INSTRUMENTS) ×1 IMPLANT
NS IRRIG 1000ML POUR BTL (IV SOLUTION) ×1 IMPLANT
PACK ORTHO EXTREMITY (CUSTOM PROCEDURE TRAY) ×1 IMPLANT
PAD ARMBOARD 7.5X6 YLW CONV (MISCELLANEOUS) ×2 IMPLANT
PAD NEG PRESSURE SENSATRAC (MISCELLANEOUS) IMPLANT
SET HNDPC FAN SPRY TIP SCT (DISPOSABLE) IMPLANT
STOCKINETTE IMPERVIOUS 9X36 MD (GAUZE/BANDAGES/DRESSINGS) IMPLANT
SUT ETHILON 2 0 PSLX (SUTURE) ×1 IMPLANT
SWAB COLLECTION DEVICE MRSA (MISCELLANEOUS) ×1 IMPLANT
SWAB CULTURE ESWAB REG 1ML (MISCELLANEOUS) IMPLANT
TOWEL GREEN STERILE (TOWEL DISPOSABLE) ×1 IMPLANT
TUBE CONNECTING 12X1/4 (SUCTIONS) ×1 IMPLANT
YANKAUER SUCT BULB TIP NO VENT (SUCTIONS) ×1 IMPLANT

## 2021-12-07 NOTE — Transfer of Care (Signed)
Immediate Anesthesia Transfer of Care Note  Patient: Kaylee Ward  Procedure(s) Performed: RIGHT FOOT DEBRIDMENT AND TISSUE GRAFT (Right: Foot)  Patient Location: PACU  Anesthesia Type:General  Level of Consciousness: awake  Airway & Oxygen Therapy: Patient Spontanous Breathing  Post-op Assessment: Report given to RN and Post -op Vital signs reviewed and stable  Post vital signs: Reviewed and stable  Patient in pain, E.J. Odono contacted for possible block  Last Vitals:  Vitals Value Taken Time  BP 141/65 12/07/21 1037  Temp    Pulse 107 12/07/21 1041  Resp 20 12/07/21 1041  SpO2 95 % 12/07/21 1041  Vitals shown include unvalidated device data.  Last Pain:  Vitals:   12/07/21 0809  TempSrc:   PainSc: 4       Patients Stated Pain Goal: 2 (12/07/21 0809)  Complications: No notable events documented.

## 2021-12-07 NOTE — Anesthesia Postprocedure Evaluation (Signed)
Anesthesia Post Note  Patient: Kaylee Ward  Procedure(s) Performed: RIGHT FOOT DEBRIDMENT AND TISSUE GRAFT (Right: Foot)     Patient location during evaluation: PACU Anesthesia Type: General Level of consciousness: awake and alert Pain management: pain level controlled Vital Signs Assessment: post-procedure vital signs reviewed and stable Respiratory status: spontaneous breathing, nonlabored ventilation, respiratory function stable and patient connected to nasal cannula oxygen Cardiovascular status: blood pressure returned to baseline and stable Postop Assessment: no apparent nausea or vomiting Anesthetic complications: no   No notable events documented.  Last Vitals:  Vitals:   12/07/21 1215 12/07/21 1230  BP: 118/85 120/82  Pulse: (!) 50 (!) 47  Resp: 15 13  Temp:  (!) 36.1 C  SpO2: 94% 94%    Last Pain:  Vitals:   12/07/21 1230  TempSrc:   PainSc: Asleep                 Woodroe Vogan

## 2021-12-07 NOTE — Anesthesia Procedure Notes (Signed)
Procedure Name: LMA Insertion Date/Time: 12/07/2021 9:57 AM  Performed by: Lynnell Chad, CRNAPre-anesthesia Checklist: Patient identified, Emergency Drugs available, Suction available and Patient being monitored Patient Re-evaluated:Patient Re-evaluated prior to induction Oxygen Delivery Method: Circle System Utilized Preoxygenation: Pre-oxygenation with 100% oxygen Induction Type: IV induction Ventilation: Mask ventilation without difficulty LMA: LMA inserted LMA Size: 4.0 Number of attempts: 1 Airway Equipment and Method: Bite block Placement Confirmation: positive ETCO2 Tube secured with: Tape Dental Injury: Teeth and Oropharynx as per pre-operative assessment

## 2021-12-07 NOTE — Progress Notes (Signed)
Orthopedic Tech Progress Note Patient Details:  Kaylee Ward [birth year] [id number]  Ortho Devices Type of Ortho Device: Postop shoe/boot Ortho Device/Splint Location: Right foot Ortho Device/Splint Interventions: [name] [name] 12/07/2021, 12:32 PM

## 2021-12-07 NOTE — Anesthesia Preprocedure Evaluation (Addendum)
Anesthesia Evaluation  Patient identified by MRN, date of birth, ID band Patient awake    Reviewed: Allergy & Precautions, NPO status , Patient's Chart, lab work & pertinent test results  History of Anesthesia Complications Negative for: history of anesthetic complications  Airway Mallampati: II  TM Distance: >3 FB Neck ROM: Full    Dental  (+) Poor Dentition, Missing, Chipped, Dental Advisory Given,    Pulmonary Current Smoker and Patient abstained from smoking.,    breath sounds clear to auscultation       Cardiovascular negative cardio ROS   Rhythm:Regular Rate:Normal     Neuro/Psych negative neurological ROS     GI/Hepatic GERD  Medicated,(+)     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,   Endo/Other  negative endocrine ROS  Renal/GU negative Renal ROS     Musculoskeletal  (+) narcotic dependent  Abdominal   Peds  Hematology  (+) Blood dyscrasia, anemia ,   Anesthesia Other Findings   Reproductive/Obstetrics                            Anesthesia Physical  Anesthesia Plan  ASA: 3  Anesthesia Plan: General   Post-op Pain Management: Toradol IV (intra-op)* and Ketamine IV*   Induction: Intravenous  PONV Risk Score and Plan: 2 and Ondansetron and Dexamethasone  Airway Management Planned: LMA  Additional Equipment: None  Intra-op Plan:   Post-operative Plan: Extubation in OR  Informed Consent: I have reviewed the patients History and Physical, chart, labs and discussed the procedure including the risks, benefits and alternatives for the proposed anesthesia with the patient or authorized representative who has indicated his/her understanding and acceptance.     Dental advisory given  Plan Discussed with: CRNA and Anesthesiologist  Anesthesia Plan Comments: (Has not used drugs in three months. )       Anesthesia Quick Evaluation

## 2021-12-07 NOTE — H&P (Signed)
Kaylee Ward is an 31 y.o. female.   Chief Complaint: Dehiscence transmetatarsal amputation right foot HPI: Patient is a 31 year old woman status post traumatic forefoot amputation.  She has undergone limb salvage intervention she has had progressive dehiscence and presents at this time for debridement and skin graft.  Past Medical History:  Diagnosis Date   Anemia    Dog bite of left lower leg 09/14/2017   S/P IRRIGATION AND DEBRIDEMENT, COMPLEX WOUND CLOSURE   GERD (gastroesophageal reflux disease)     Past Surgical History:  Procedure Laterality Date   AMPUTATION Right 09/21/2021   Procedure: RIGHT FOOT TRANSMETATARSAL AMPUTATION AND SKIN GRAFT;  Surgeon: Nadara Mustard, MD;  Location: [facility] OR;  Service: Orthopedics;  Laterality: Right;   AMPUTATION TOE Right 09/17/2021   Procedure: GUILLOTINE TRANSMETATARSAL AMPUTATION RIGHT FOOT;  Surgeon: Myrene Galas, MD;  Location: [facility] OR;  Service: Orthopedics;  Laterality: Right;   APPLICATION OF WOUND VAC Right 09/17/2021   Procedure: APPLICATION OF WOUND VAC RIGHT FOOT;  Surgeon: Myrene Galas, MD;  Location: [facility] OR;  Service: Orthopedics;  Laterality: Right;   I & D EXTREMITY Left 09/14/2017   Procedure: IRRIGATION AND DEBRIDEMENT, COMPLEX WOUND CLOSURE LEFT LEG;  Surgeon: Bjorn Pippin, MD;  Location: [facility] OR;  Service: Orthopedics;  Laterality: Left;   I & D EXTREMITY Right 09/17/2021   Procedure: IRRIGATION AND DEBRIDEMENT RIGHT FOOT, DEBRIDEMENT OF OPEN FRACTURES INCLUDED, CLOSED TREATMENT OF RIGHT  ACETABULUM WITH C-ARM;  Surgeon: Myrene Galas, MD;  Location: [facility] OR;  Service: Orthopedics;  Laterality: Right;   INCISION AND DRAINAGE Left 09/14/2017   IRRIGATION AND DEBRIDEMENT, COMPLEX WOUND CLOSURE LEFT LEG   TONSILLECTOMY AND ADENOIDECTOMY  ~ 2005   TUBAL LIGATION  03/2015    No family history on file. Social History:  reports that she has been smoking cigarettes. She has a 4.50 pack-year smoking history. She has never used smokeless  tobacco. She reports current alcohol use. She reports current drug use. Frequency: 7.00 times per week. Drug: Marijuana.  Allergies: No Known Allergies  No medications prior to admission.    No results found for this or any previous visit (from the past 48 hour(s)). No results found.  Review of Systems  All other systems reviewed and are negative.   Last menstrual period 12/04/2021, unknown if currently breastfeeding. Physical Exam  Patient is alert, oriented, no adenopathy, well-dressed, normal affect, normal respiratory effort. Examination the wound bed dorsally of her foot has healthy granulation tissue there has been no interval healing over the past 2 months but the wound bed is healthy it measures 5 x 6 cm with granulation tissue.  There is no cellulitis no abscess no signs of infection. Assessment/Plan 1. Degloving injury of dorsum of foot   2. Amputation of toe of right foot (HCC)       Plan: Patient's wound bed is stable healthy and viable she has a large open wound that would require surgical debridement and application of biologic tissue graft.  We will plan for debridement of the dorsal right foot wound placement of Kerecis micro powder and Kerecis tissue graft with a Prevena wound VAC outpatient surgery.  Risks and benefits were discussed including need for additional surgery.  Patient states she understands wished to proceed at this time.  Nadara Mustard, MD 12/07/2021, 6:47 AM

## 2021-12-07 NOTE — Interval H&P Note (Signed)
History and Physical Interval Note:  12/07/2021 9:45 AM  Kaylee Ward  has presented today for surgery, with the diagnosis of Dehiscence Right Foot Transmetatarsal Amputation.  The various methods of treatment have been discussed with the patient and family. After consideration of risks, benefits and other options for treatment, the patient has consented to  Procedure(s): RIGHT FOOT DEBRIDMENT AND APPLY TISSUE GRAFT (Right) as a surgical intervention.  The patient's history has been reviewed, patient examined, no change in status, stable for surgery.  I have reviewed the patient's chart and labs.  Questions were answered to the patient's satisfaction.     Nadara Mustard

## 2021-12-07 NOTE — Op Note (Signed)
12/07/2021  10:36 AM  PATIENT:  [name]    PRE-OPERATIVE DIAGNOSIS:  Dehiscence Right Foot Transmetatarsal Amputation  POST-OPERATIVE DIAGNOSIS:  Same  PROCEDURE: Right foot [place] amputation. Local tissue rearrangement for wound closure 7 x 10 cm. Application of Kerecis tissue graft 7 x 10 cm and micro graft 38 cm. Application of Prevena 13 cm wound VAC.   SURGEON:  Nadara Mustard, MD  PHYSICIAN ASSISTANT:None ANESTHESIA:   General  PREOPERATIVE INDICATIONS:  Kaylee Ward is a  31 y.o. female with a diagnosis of Dehiscence Right Foot Transmetatarsal Amputation who failed conservative measures and elected for surgical management.    The risks benefits and alternatives were discussed with the patient preoperatively including but not limited to the risks of infection, bleeding, nerve injury, cardiopulmonary complications, the need for revision surgery, among others, and the patient was willing to proceed.  OPERATIVE IMPLANTS: Kerecis tissue graft 7 x 10 cm and Kerecis micro graft 38 cm.  @ENCIMAGES @  OPERATIVE FINDINGS: Patient had fibrinous tissue with exposed bone which required resection of 2 cm of the bone.  After resection of the fibrinous scar tissue this left a wound that was 7 x 10 cm.  OPERATIVE PROCEDURE: Patient was brought the operating room and underwent a general anesthetic.  After adequate levels anesthesia obtained patient's right lower extremity was prepped using DuraPrep draped into a sterile field a timeout was called.  A 21 blade knife was used to debride the fibrinous tissue.  After debridement of nonviable tissue there was exposed bone.  This required a resection and more proximal amputation.  Oscillating saw was used to complete the show part type amputation.  Electrocautery was used hemostasis the wound was irrigated with normal saline.  This left the wound was 7 x 10 cm.  The Kerecis micro powder was applied to the wound 38 cm this was then covered  with the Kerecis sheet that was stacked that was 7 x 10 cm.  Local tissue rearrangement was then used to close the wound over the [facility] tissue 7 x 10 cm.  A Prevena 13 cm wound VAC was applied this had a good suction fit patient was extubated taken the PACU in stable condition.  Debridement type: Excisional Debridement  Side: right  Body Location: foot   Tools used for debridement: scalpel and rongeur  Pre-debridement Wound size (cm):   Length: 7        Width: 5     Depth: 1   Post-debridement Wound size (cm):   Length: 10        Width: 7     Depth: 1   Debridement depth beyond dead/damaged tissue down to healthy viable tissue: yes  Tissue layer involved: skin, subcutaneous tissue, muscle / fascia, bone  Nature of tissue removed: [place], [facility], and Non-viable tissue  Irrigation volume: 1 liter     Irrigation fluid type: Normal Saline     DISCHARGE PLANNING:  Antibiotic duration: Preoperative antibiotics  Weightbearing: Nonweightbearing on the right  Pain medication: Prescription for Percocet  Dressing care/ Wound VAC: Wound VAC for 1 week  Ambulatory devices: Crutches  Discharge to: Home.  Follow-up: In the office 1 week post operative.

## 2021-12-08 ENCOUNTER — Encounter (HOSPITAL_COMMUNITY): Payer: Self-pay | Admitting: Orthopedic Surgery

## 2021-12-14 ENCOUNTER — Encounter: Payer: Self-pay | Admitting: Family

## 2021-12-14 ENCOUNTER — Ambulatory Visit (INDEPENDENT_AMBULATORY_CARE_PROVIDER_SITE_OTHER): Payer: Medicaid Other | Admitting: Family

## 2021-12-14 ENCOUNTER — Other Ambulatory Visit (HOSPITAL_COMMUNITY): Payer: Self-pay

## 2021-12-14 DIAGNOSIS — Z89431 Acquired absence of right foot: Secondary | ICD-10-CM

## 2021-12-14 MED ORDER — OXYCODONE-ACETAMINOPHEN 5-325 MG PO TABS
1.0000 | ORAL_TABLET | ORAL | 0 refills | Status: DC | PRN
  Filled 2021-12-14: qty 42, 7d supply, fill #0
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
## 2021-12-22 ENCOUNTER — Other Ambulatory Visit: Payer: Self-pay | Admitting: Family

## 2021-12-23 ENCOUNTER — Other Ambulatory Visit (HOSPITAL_COMMUNITY): Payer: Self-pay

## 2021-12-23 ENCOUNTER — Telehealth: Payer: Self-pay

## 2021-12-23 MED ORDER — OXYCODONE-ACETAMINOPHEN 5-325 MG PO TABS
1.0000 | ORAL_TABLET | Freq: Four times a day (QID) | ORAL | 0 refills | Status: DC | PRN
  Filled 2021-12-23: qty 42, 11d supply, fill #0

## 2021-12-23 NOTE — Telephone Encounter (Signed)
Prior [place] sent from [facility] out patient pharmacy for Oxycodone 5/325. Prior auth entered in [place] [employer] database and the current status is suspended. Will hold and check back

## 2021-12-24 ENCOUNTER — Other Ambulatory Visit (HOSPITAL_COMMUNITY): Payer: Self-pay

## 2021-12-26 NOTE — Telephone Encounter (Signed)
Checked [place] protal this morning and the pt's medication has been approved through 03/23/2022

## 2021-12-28 ENCOUNTER — Encounter: Payer: Self-pay | Admitting: Family

## 2021-12-28 ENCOUNTER — Other Ambulatory Visit (HOSPITAL_COMMUNITY): Payer: Self-pay

## 2021-12-28 ENCOUNTER — Ambulatory Visit (INDEPENDENT_AMBULATORY_CARE_PROVIDER_SITE_OTHER): Payer: Medicaid Other | Admitting: Family

## 2021-12-28 DIAGNOSIS — Z89431 Acquired absence of right foot: Secondary | ICD-10-CM

## 2021-12-28 MED ORDER — AMOXICILLIN-POT CLAVULANATE 500-125 MG PO TABS
1.0000 | ORAL_TABLET | Freq: Three times a day (TID) | ORAL | 0 refills | Status: AC
  Filled 2021-12-28 (×2): qty 30, 10d supply, fill #0
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
## 2022-01-02 ENCOUNTER — Encounter: Payer: Self-pay | Admitting: Orthopedic Surgery

## 2022-01-02 ENCOUNTER — Ambulatory Visit (INDEPENDENT_AMBULATORY_CARE_PROVIDER_SITE_OTHER): Payer: Medicaid Other | Admitting: Orthopedic Surgery

## 2022-01-02 DIAGNOSIS — S91309A Unspecified open wound, unspecified foot, initial encounter: Secondary | ICD-10-CM

## 2022-01-02 DIAGNOSIS — Z89431 Acquired absence of right foot: Secondary | ICD-10-CM

## 2022-01-02 DIAGNOSIS — S91301A Unspecified open wound, right foot, initial encounter: Secondary | ICD-10-CM
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
## 2022-01-12 ENCOUNTER — Other Ambulatory Visit (HOSPITAL_COMMUNITY): Payer: Self-pay

## 2022-01-12 ENCOUNTER — Ambulatory Visit (INDEPENDENT_AMBULATORY_CARE_PROVIDER_SITE_OTHER): Payer: Medicaid Other | Admitting: Orthopedic Surgery

## 2022-01-12 ENCOUNTER — Other Ambulatory Visit: Payer: Self-pay | Admitting: Family

## 2022-01-12 ENCOUNTER — Encounter: Payer: Self-pay | Admitting: Orthopedic Surgery

## 2022-01-12 ENCOUNTER — Other Ambulatory Visit: Payer: Self-pay | Admitting: Orthopedic Surgery

## 2022-01-12 ENCOUNTER — Telehealth: Payer: Self-pay | Admitting: Family

## 2022-01-12 DIAGNOSIS — Z89431 Acquired absence of right foot: Secondary | ICD-10-CM

## 2022-01-12 MED ORDER — OXYCODONE-ACETAMINOPHEN 5-325 MG PO TABS: 1.0000 | ORAL_TABLET | Freq: Four times a day (QID) | ORAL | 0 refills | Status: DC | PRN

## 2022-01-12 MED ORDER — OXYCODONE-ACETAMINOPHEN 5-325 MG PO TABS
1.0000 | ORAL_TABLET | Freq: Four times a day (QID) | ORAL | 0 refills | Status: AC | PRN
  Filled 2022-01-12 – 2022-01-13 (×2): qty 30, 8d supply, fill #0

## 2022-01-12 NOTE — Telephone Encounter (Signed)
Oxycodone was sent to [facility] this morning, but needs to go to [place] out pt community pharmacy please, thank you.

## 2022-01-12 NOTE — Telephone Encounter (Signed)
Pt had an appt this morning and went to pharmacy for refill of oxycodone. Please send to pharmacy on file. Please call pt when called in at [phone].
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
## 2022-01-13 ENCOUNTER — Other Ambulatory Visit (HOSPITAL_COMMUNITY): Payer: Self-pay

## 2022-01-14 ENCOUNTER — Other Ambulatory Visit (HOSPITAL_COMMUNITY): Payer: Self-pay

## 2022-01-26 ENCOUNTER — Ambulatory Visit: Payer: Medicaid Other | Admitting: Orthopedic Surgery

## 2022-02-02 ENCOUNTER — Encounter: Payer: Self-pay | Admitting: Orthopedic Surgery

## 2022-02-02 ENCOUNTER — Ambulatory Visit (INDEPENDENT_AMBULATORY_CARE_PROVIDER_SITE_OTHER): Payer: Medicaid Other | Admitting: Orthopedic Surgery

## 2022-02-02 DIAGNOSIS — Z89431 Acquired absence of right foot: Secondary | ICD-10-CM
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
## 2022-02-10 ENCOUNTER — Encounter: Payer: Medicaid Other | Admitting: Physical Medicine and Rehabilitation

## 2022-02-23 ENCOUNTER — Ambulatory Visit (INDEPENDENT_AMBULATORY_CARE_PROVIDER_SITE_OTHER): Payer: Medicaid Other | Admitting: Orthopedic Surgery

## 2022-02-23 ENCOUNTER — Encounter: Payer: Self-pay | Admitting: Orthopedic Surgery

## 2022-02-23 DIAGNOSIS — Z89431 Acquired absence of right foot: Secondary | ICD-10-CM
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
## 2022-03-23 ENCOUNTER — Encounter: Payer: Self-pay | Admitting: Orthopedic Surgery

## 2022-03-23 ENCOUNTER — Ambulatory Visit (INDEPENDENT_AMBULATORY_CARE_PROVIDER_SITE_OTHER): Payer: Medicaid Other | Admitting: Orthopedic Surgery

## 2022-03-23 DIAGNOSIS — Z89431 Acquired absence of right foot: Secondary | ICD-10-CM | POA: Diagnosis not present
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
## 2022-04-20 ENCOUNTER — Ambulatory Visit (INDEPENDENT_AMBULATORY_CARE_PROVIDER_SITE_OTHER): Payer: Medicaid Other | Admitting: Orthopedic Surgery

## 2022-04-20 DIAGNOSIS — Z89431 Acquired absence of right foot: Secondary | ICD-10-CM | POA: Diagnosis not present

## 2022-04-21 ENCOUNTER — Encounter: Payer: Self-pay | Admitting: Orthopedic Surgery
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
## 2022-04-25 ENCOUNTER — Telehealth: Payer: Self-pay

## 2022-04-25 NOTE — Telephone Encounter (Signed)
I called and sw pt and advised that I did hear back from [facility] and that if cost is the issue then they can make just the one orthotic for her and this will be a cash price of $150. They do like to have orthotics made for both sides for balance but if she is not able to do this then she can do just the surgical side. Pt voiced understanding and encouraged her to call the office and see if she can work out a payment arrangement.

## 2022-04-25 NOTE — Telephone Encounter (Signed)
-----   Message from Pamella Pert, [place] sent at 04/21/2022  9:23 AM EST ----- Regarding: RE: call [employer] Email sent ot day will hold and monitor for response.  ----- Message ----- From: Pamella Pert, RMA Sent: 04/20/2022   1:55 PM EST To: Pamella Pert, RMA Subject: call [facility]                                    Call and ask [facility] about this pt and her carbon fiber plate for transmet amp. Pt is stating she will need something for the other shoe as weel? And that this can range in price between 150-300$ as medicaid does not cover bc the pt is not diabetic.

## 2022-07-10 ENCOUNTER — Telehealth: Payer: Self-pay | Admitting: Orthopedic Surgery

## 2022-07-10 NOTE — Telephone Encounter (Signed)
Can you please write rx for [facility] insert for shoe

## 2022-07-10 NOTE — Telephone Encounter (Signed)
Pt called requesting a script be sent to something that goes in her shoe. Pt states she can't remember the name of what she need. Pt states she had some of her foot amputated. Pt states Dr Sharol Given sent a script to [facility] before but pt was unable to come up with the money and script expired and she need a new script faxed to [facility]. Pt phone number is [phone].

## 2022-07-20 ENCOUNTER — Other Ambulatory Visit (HOSPITAL_COMMUNITY): Payer: Self-pay

## 2022-07-20 ENCOUNTER — Ambulatory Visit (INDEPENDENT_AMBULATORY_CARE_PROVIDER_SITE_OTHER): Payer: Medicaid Other | Admitting: Orthopedic Surgery

## 2022-07-20 DIAGNOSIS — Z89431 Acquired absence of right foot: Secondary | ICD-10-CM

## 2022-07-20 MED ORDER — GABAPENTIN 400 MG PO CAPS
400.0000 mg | ORAL_CAPSULE | Freq: Three times a day (TID) | ORAL | 3 refills | Status: AC
Start: 2022-07-20 — End: ?
  Filled 2022-07-20: qty 90, 30d supply, fill #0

## 2022-07-21 ENCOUNTER — Encounter: Payer: Self-pay | Admitting: Orthopedic Surgery
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
## 2022-07-27 ENCOUNTER — Other Ambulatory Visit (HOSPITAL_COMMUNITY): Payer: Self-pay

## 2022-10-24 ENCOUNTER — Ambulatory Visit: Payer: Medicaid Other | Admitting: Orthopedic Surgery

## 2022-11-02 ENCOUNTER — Ambulatory Visit: Payer: Medicaid Other | Admitting: Orthopedic Surgery

## 2022-11-21 ENCOUNTER — Ambulatory Visit: Payer: MEDICAID | Admitting: Orthopedic Surgery

## 2023-01-11 ENCOUNTER — Ambulatory Visit: Payer: MEDICAID | Admitting: Orthopedic Surgery

## 2023-08-27 ENCOUNTER — Encounter: Payer: Self-pay | Admitting: Orthopedic Surgery

## 2023-08-27 ENCOUNTER — Ambulatory Visit (INDEPENDENT_AMBULATORY_CARE_PROVIDER_SITE_OTHER): Payer: MEDICAID | Admitting: Orthopedic Surgery

## 2023-08-27 DIAGNOSIS — Z89431 Acquired absence of right foot: Secondary | ICD-10-CM

## 2023-08-27 DIAGNOSIS — L97511 Non-pressure chronic ulcer of other part of right foot limited to breakdown of skin: Secondary | ICD-10-CM | POA: Diagnosis not present

## 2023-08-27 DIAGNOSIS — I872 Venous insufficiency (chronic) (peripheral): Secondary | ICD-10-CM
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
## 2024-02-18 ENCOUNTER — Encounter: Payer: Self-pay | Admitting: Radiology

## 2024-03-03 IMAGING — RF DG TOE 5TH 2+V*R*
1 series · 1 of 1 positions shown · non-contrast
Comparison: 09/17/2021

CLINICAL DATA: Amputation

EXAM:
RIGHT FIFTH TOE

[Series 1: run · 1 of 1 slices shown]
[im 1/1]
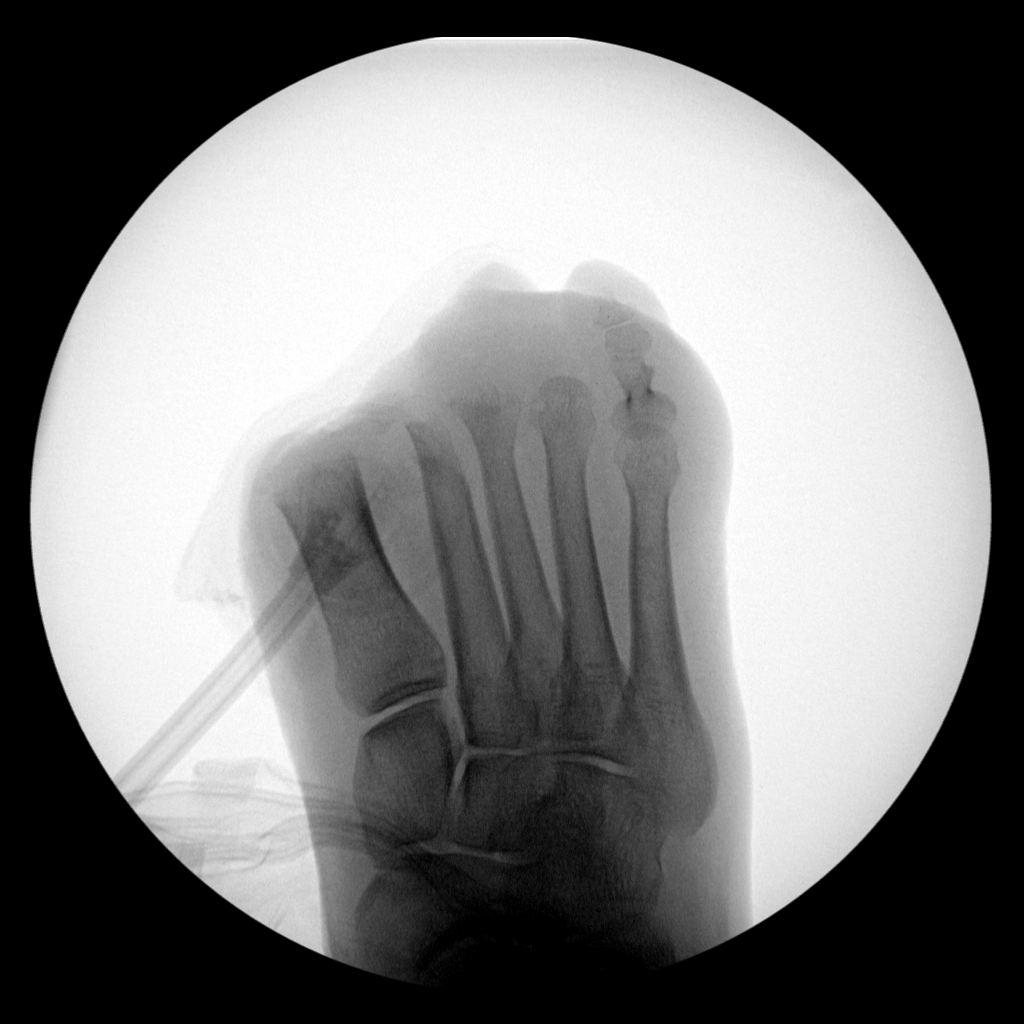

[1 of 1 positions shown; findings below may reference images not displayed]

FINDINGS: Single low resolution intraoperative spot view of the right toes.
Total fluoroscopy time was 10 seconds, fluoroscopic dose of 1.7 mGy.
The images demonstrate trans metatarsal amputation of the first
second and third digits at the level of the distal metatarsals.
Amputation of the fourth digit at the level of the MCP joint.
IMPRESSION: Intraoperative fluoroscopic assistance provided during foot surgery
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
